# Patient Record
Sex: Female | Born: 1985 | Race: Black or African American | Hispanic: No | Marital: Single | Smoking: Never smoker
Health system: Southern US, Community
[De-identification: ages and names within clinical notes are randomized; demographics above are authoritative.]

## PROBLEM LIST (undated history)

## (undated) DIAGNOSIS — I10 Essential (primary) hypertension: Secondary | ICD-10-CM

---

## 2013-09-04 ENCOUNTER — Emergency Department: Payer: Self-pay | Admitting: Emergency Medicine

## 2013-11-15 ENCOUNTER — Emergency Department (HOSPITAL_COMMUNITY): Payer: Medicaid Other

## 2013-11-15 ENCOUNTER — Encounter (HOSPITAL_COMMUNITY): Payer: Self-pay | Admitting: Emergency Medicine

## 2013-11-15 ENCOUNTER — Emergency Department (HOSPITAL_COMMUNITY)
Admission: EM | Admit: 2013-11-15 | Discharge: 2013-11-15 | Disposition: A | Discharge status: Home / Self Care | Payer: Medicaid Other | Attending: Emergency Medicine | Admitting: Emergency Medicine

## 2013-11-15 DIAGNOSIS — Y9359 Activity, other involving other sports and athletics played individually: Secondary | ICD-10-CM | POA: Diagnosis not present

## 2013-11-15 DIAGNOSIS — S46909A Unspecified injury of unspecified muscle, fascia and tendon at shoulder and upper arm level, unspecified arm, initial encounter: Secondary | ICD-10-CM | POA: Diagnosis present

## 2013-11-15 DIAGNOSIS — Y9239 Other specified sports and athletic area as the place of occurrence of the external cause: Secondary | ICD-10-CM | POA: Insufficient documentation

## 2013-11-15 DIAGNOSIS — S40012A Contusion of left shoulder, initial encounter: Secondary | ICD-10-CM

## 2013-11-15 DIAGNOSIS — S4980XA Other specified injuries of shoulder and upper arm, unspecified arm, initial encounter: Secondary | ICD-10-CM | POA: Insufficient documentation

## 2013-11-15 DIAGNOSIS — S40019A Contusion of unspecified shoulder, initial encounter: Secondary | ICD-10-CM | POA: Insufficient documentation

## 2013-11-15 DIAGNOSIS — Y92838 Other recreation area as the place of occurrence of the external cause: Secondary | ICD-10-CM

## 2013-11-15 DIAGNOSIS — W1789XA Other fall from one level to another, initial encounter: Secondary | ICD-10-CM | POA: Diagnosis not present

## 2013-11-15 MED ORDER — HYDROCODONE-ACETAMINOPHEN 5-325 MG PO TABS
2.0000 | ORAL_TABLET | Freq: Once | ORAL | Status: AC
  Administered 2013-11-15: 2 via ORAL
  Filled 2013-11-15: qty 2

## 2013-11-15 MED ORDER — HYDROCODONE-ACETAMINOPHEN 5-325 MG PO TABS: 1.0000 | ORAL_TABLET | Freq: Four times a day (QID) | ORAL | Status: DC | PRN

## 2013-11-15 NOTE — ED Notes (Signed)
Pt presents with c/o left shoulder pain. Pt says that [place] and [place] of the ride landing on that shoulder. Pt unsure of the height that she fell from, pt says that she landed on the slide when she fell.

## 2013-11-15 NOTE — ED Provider Notes (Addendum)
CSN: [id number]     Arrival date & time 11/15/13  1502 History   First MD Initiated Contact with Patient 11/15/13 1529     Chief Complaint  Patient presents with  . Shoulder Pain     (Consider location/radiation/quality/duration/timing/severity/associated sxs/prior Treatment) HPI Complains of left anterior shoulder pain, nonradiating onset p.m. today after she [place]. She reports that her left shoulder struck a slide . Pain is worse when she moves her shoulder. Pain is constant anterior shoulder nonradiating. No other associated symptoms. No treatment prior to coming History reviewed. No pertinent past medical history. History reviewed. No pertinent past surgical history. No family history on file. History  Substance Use Topics  . Smoking status: Never Smoker   . Smokeless tobacco: Not on file  . Alcohol Use: No   OB History   Grav Para Term Preterm Abortions TAB SAB Ect Mult Living                 Review of Systems  Constitutional: Negative.   Respiratory: Negative.   Musculoskeletal: Positive for arthralgias.       Left shoulder pain      Allergies  Review of patient's allergies indicates no known allergies.  Home Medications   Prior to Admission medications   Not on File   BP 142/90  Pulse 110  Temp(Src) 99.1 F (37.3 C) (Oral)  Resp 16  SpO2 99%  LMP 11/01/2013 Physical Exam  Nursing note and vitals reviewed. Constitutional: She is oriented to person, place, and time. She appears well-developed and well-nourished. No distress.  HENT:  Head: Normocephalic and atraumatic.  Eyes: Conjunctivae are normal. Pupils are equal, round, and reactive to light.  Neck: Neck supple. No tracheal deviation present.  Cardiovascular: Regular rhythm.   Pulmonary/Chest: Effort normal and breath sounds normal.  Abdominal:  obese  Musculoskeletal: Normal range of motion. She exhibits no edema and no tenderness.  Left upper extremity skin  intact. No deformity no swelling tender at anterior shoulder. No a.c. joint tenderness. No clavicle tenderness. Radial pulse 2+. Full range of motion of elbow wrist and hand. She will not move shoulder in any direction. All other extremities a contusion abrasion or tenderness neurovascular intact  Neurological: She is alert and oriented to person, place, and time. Coordination normal.  Skin: Skin is warm and dry. No rash noted.  Psychiatric: She has a normal mood and affect.    ED Course  Procedures (including critical care time) Labs Review Labs Reviewed - No data to display  Imaging Review No results found.   EKG Interpretation None     430 p.m. she reports pain not improved after treatment with Norco however she does feel ready go home. She appears in no distress. X-rays viewed by me No results found for this or any previous visit. Dg Shoulder Left  11/15/2013   CLINICAL DATA:  Larey Seat onto LEFT shoulder, pain anteriorly  EXAM: LEFT SHOULDER - 2+ VIEW  COMPARISON:  None  FINDINGS: Unable to obtain axillary view due to pain.  Osseous mineralization normal.  AC joint alignment grossly normal.  No acute fracture, dislocation or bone destruction.  Visualized LEFT ribs intact.  IMPRESSION: No acute osseous abnormalities.   Electronically Signed   By: Ulyses Southward M.D.   On: 11/15/2013 16:16    MDM  Plan prescription Norco. Orthopedic referral. I did not recommend shoulder mobilization as concerned for adhesive capsulitis Dx contusion of  Left shoulder  Final diagnoses:  None        Doug SouSam Perez Dirico, MD 11/15/13 1639  Doug SouSam Taima Rada, MD 11/15/13 [phone]

## 2013-11-15 NOTE — Discharge Instructions (Signed)
Contusion Take Advil for mild pain or the pain medicine prescribed for bad pain. Try to use your shoulder as much as possible. Call Dr.Duda to schedule an office appointment if having significant pain or difficulty using your shoulder in 3 or 4 days Shoulder Exercises EXERCISES  RANGE OF MOTION (ROM) AND STRETCHING EXERCISES These exercises may help you when beginning to rehabilitate your injury. Your symptoms may resolve with or without further involvement from your physician, physical therapist or athletic trainer. While completing these exercises, remember:   Restoring tissue flexibility helps normal motion to return to the joints. This allows healthier, less painful movement and activity.  An effective stretch should be held for at least 30 seconds.  A stretch should never be painful. You should only feel a gentle lengthening or release in the stretched tissue. ROM - Pendulum  Bend at the waist so that your right / left arm falls away from your body. Support yourself with your opposite hand on a solid surface, such as a table or a countertop.  Your right / left arm should be perpendicular to the ground. If it is not perpendicular, you need to lean over farther. Relax the muscles in your right / left arm and shoulder as much as possible.  Gently sway your hips and trunk so they move your right / left arm without any use of your right / left shoulder muscles.  Progress your movements so that your right / left arm moves side to side, then forward and backward, and finally, both clockwise and counterclockwise.  Complete __________ repetitions in each direction. Many people use this exercise to relieve discomfort in their shoulder as well as to gain range of motion. Repeat __________ times. Complete this exercise __________ times per day. STRETCH - Flexion, Standing  Stand with good posture. With an underhand grip on your right / left hand and an overhand grip on the opposite hand, grasp a  broomstick or cane so that your hands are a little more than shoulder-width apart.  Keeping your right / left elbow straight and shoulder muscles relaxed, push the stick with your opposite hand to raise your right / left arm in front of your body and then overhead. Raise your arm until you feel a stretch in your right / left shoulder, but before you have increased shoulder pain.  Try to avoid shrugging your right / left shoulder as your arm rises by keeping your shoulder blade tucked down and toward your mid-back spine. Hold __________ seconds.  Slowly return to the starting position. Repeat __________ times. Complete this exercise __________ times per day. STRETCH - Internal Rotation  Place your right / left hand behind your back, palm-up.  Throw a towel or belt over your opposite shoulder. Grasp the towel/belt with your right / left hand.  While keeping an upright posture, gently pull up on the towel/belt until you feel a stretch in the front of your right / left shoulder.  Avoid shrugging your right / left shoulder as your arm rises by keeping your shoulder blade tucked down and toward your mid-back spine.  Hold __________. Release the stretch by lowering your opposite hand. Repeat __________ times. Complete this exercise __________ times per day. STRETCH - External Rotation and Abduction  Stagger your stance through a doorframe. It does not matter which foot is forward.  As instructed by your physician, physical therapist or athletic trainer, place your hands:  And forearms above your head and on the door frame.  And  forearms at head-height and on the door frame.  At elbow-height and on the door frame.  Keeping your head and chest upright and your stomach muscles tight to prevent over-extending your low-back, slowly shift your weight onto your front foot until you feel a stretch across your chest and/or in the front of your shoulders.  Hold __________ seconds. Shift your weight  to your back foot to release the stretch. Repeat __________ times. Complete this stretch __________ times per day.  STRENGTHENING EXERCISES  These exercises may help you when beginning to rehabilitate your injury. They may resolve your symptoms with or without further involvement from your physician, physical therapist or athletic trainer. While completing these exercises, remember:   Muscles can gain both the endurance and the strength needed for everyday activities through controlled exercises.  Complete these exercises as instructed by your physician, physical therapist or athletic trainer. Progress the resistance and repetitions only as guided.  You may experience muscle soreness or fatigue, but the pain or discomfort you are trying to eliminate should never worsen during these exercises. If this pain does worsen, stop and make certain you are following the directions exactly. If the pain is still present after adjustments, discontinue the exercise until you can discuss the trouble with your clinician.  If advised by your physician, during your recovery, avoid activity or exercises which involve actions that place your right / left hand or elbow above your head or behind your back or head. These positions stress the tissues which are trying to heal. STRENGTH - Scapular Depression and Adduction  With good posture, sit on a firm chair. Supported your arms in front of you with pillows, arm rests or a table top. Have your elbows in line with the sides of your body.  Gently draw your shoulder blades down and toward your mid-back spine. Gradually increase the tension without tensing the muscles along the top of your shoulders and the back of your neck.  Hold for __________ seconds. Slowly release the tension and relax your muscles completely before completing the next repetition.  After you have practiced this exercise, remove the arm support and complete it in standing as well as sitting. Repeat  __________ times. Complete this exercise __________ times per day.  STRENGTH - External Rotators  Secure a rubber exercise band/tubing to a fixed object so that it is at the same height as your right / left elbow when you are standing or sitting on a firm surface.  Stand or sit so that the secured exercise band/tubing is at your side that is not injured.  Bend your elbow 90 degrees. Place a folded towel or small pillow under your right / left arm so that your elbow is a few inches away from your side.  Keeping the tension on the exercise band/tubing, pull it away from your body, as if pivoting on your elbow. Be sure to keep your body steady so that the movement is only coming from your shoulder rotating.  Hold __________ seconds. Release the tension in a controlled manner as you return to the starting position. Repeat __________ times. Complete this exercise __________ times per day.  STRENGTH - Supraspinatus  Stand or sit with good posture. Grasp a __________ weight or an exercise band/tubing so that your hand is "thumbs-up," like when you shake hands.  Slowly lift your right / left hand from your thigh into the air, traveling about 30 degrees from straight out at your side. Lift your hand to shoulder height  or as far as you can without increasing any shoulder pain. Initially, many people do not lift their hands above shoulder height.  Avoid shrugging your right / left shoulder as your arm rises by keeping your shoulder blade tucked down and toward your mid-back spine.  Hold for __________ seconds. Control the descent of your hand as you slowly return to your starting position. Repeat __________ times. Complete this exercise __________ times per day.  STRENGTH - Shoulder Extensors  Secure a rubber exercise band/tubing so that it is at the height of your shoulders when you are either standing or sitting on a firm arm-less chair.  With a thumbs-up grip, grasp an end of the band/tubing in  each hand. Straighten your elbows and lift your hands straight in front of you at shoulder height. Step back away from the secured end of band/tubing until it becomes tense.  Squeezing your shoulder blades together, pull your hands down to the sides of your thighs. Do not allow your hands to go behind you.  Hold for __________ seconds. Slowly ease the tension on the band/tubing as you reverse the directions and return to the starting position. Repeat __________ times. Complete this exercise __________ times per day.  STRENGTH - Scapular Retractors  Secure a rubber exercise band/tubing so that it is at the height of your shoulders when you are either standing or sitting on a firm arm-less chair.  With a palm-down grip, grasp an end of the band/tubing in each hand. Straighten your elbows and lift your hands straight in front of you at shoulder height. Step back away from the secured end of band/tubing until it becomes tense.  Squeezing your shoulder blades together, draw your elbows back as you bend them. Keep your upper arm lifted away from your body throughout the exercise.  Hold __________ seconds. Slowly ease the tension on the band/tubing as you reverse the directions and return to the starting position. Repeat __________ times. Complete this exercise __________ times per day. STRENGTH - Scapular Depressors  Find a sturdy chair without wheels, such as a from a dining room table.  Keeping your feet on the floor, lift your bottom from the seat and lock your elbows.  Keeping your elbows straight, allow gravity to pull your body weight down. Your shoulders will rise toward your ears.  Raise your body against gravity by drawing your shoulder blades down your back, shortening the distance between your shoulders and ears. Although your feet should always maintain contact with the floor, your feet should progressively support less body weight as you get stronger.  Hold __________ seconds. In a  controlled and slow manner, lower your body weight to begin the next repetition. Repeat __________ times. Complete this exercise __________ times per day.  Document Released: 02/01/2005 Document Revised: 06/12/2011 Document Reviewed: 07/02/2008 [facility] Patient Information 2015 [place], [place]. This information is not intended to replace advice given to you by your health care provider. Make sure you discuss any questions you have with your health care provider.  A contusion is a deep bruise. Contusions happen when an injury causes bleeding under the skin. Signs of bruising include pain, puffiness (swelling), and discolored skin. The contusion may turn blue, purple, or yellow. HOME CARE   Put ice on the injured area.  Put ice in a plastic bag.  Place a towel between your skin and the bag.  Leave the ice on for 15-20 minutes, 03-04 times a day.  Only take medicine as told by your doctor.  Rest the injured area.  If possible, raise (elevate) the injured area to lessen puffiness. GET HELP RIGHT AWAY IF:   You have more bruising or puffiness.  You have pain that is getting worse.  Your puffiness or pain is not helped by medicine. MAKE SURE YOU:   Understand these instructions.  Will watch your condition.  Will get help right away if you are not doing well or get worse. Document Released: 09/06/2007 Document Revised: 06/12/2011 Document Reviewed: 01/23/2011 [facility] Patient Information 2015 [place], [place]. This information is not intended to replace advice given to you by your health care provider. Make sure you discuss any questions you have with your health care provider.

## 2013-11-16 ENCOUNTER — Emergency Department: Payer: Self-pay | Admitting: Student

## 2014-02-01 ENCOUNTER — Emergency Department: Payer: Self-pay | Admitting: Emergency Medicine

## 2014-02-26 ENCOUNTER — Emergency Department: Payer: Self-pay | Admitting: Emergency Medicine

## 2014-05-10 ENCOUNTER — Emergency Department: Payer: Self-pay | Admitting: Emergency Medicine

## 2014-12-03 ENCOUNTER — Other Ambulatory Visit: Payer: Self-pay

## 2014-12-03 ENCOUNTER — Emergency Department
Admission: EM | Admit: 2014-12-03 | Discharge: 2014-12-03 | Disposition: A | Discharge status: Home / Self Care | Payer: Medicaid Other | Attending: Emergency Medicine | Admitting: Emergency Medicine

## 2014-12-03 ENCOUNTER — Emergency Department: Payer: Medicaid Other

## 2014-12-03 DIAGNOSIS — R079 Chest pain, unspecified: Secondary | ICD-10-CM | POA: Diagnosis not present

## 2014-12-03 LAB — BASIC METABOLIC PANEL
ANION GAP: 7 (ref 5–15)
CALCIUM: 9.3 mg/dL (ref 8.9–10.3)
CO2: 29 mmol/L (ref 22–32)
Chloride: 104 mmol/L (ref 101–111)
Creatinine, Ser: 0.76 mg/dL (ref 0.44–1.00)
GFR calc Af Amer: >60 mL/min (ref >60)
GFR calc non Af Amer: >60 mL/min (ref >60)
Glucose, Bld: 107 mg/dL — ABNORMAL HIGH (ref 65–99)
Potassium: 3.7 mmol/L (ref 3.5–5.1)
Sodium: 140 mmol/L (ref 135–145)

## 2014-12-03 LAB — CBC
HCT: 37.8 % (ref 35.0–47.0)
HEMOGLOBIN: 12 g/dL (ref 12.0–16.0)
MCH: 22.2 pg — AB (ref 26.0–34.0)
MCHC: 31.7 g/dL — ABNORMAL LOW (ref 32.0–36.0)
MCV: 70.2 fL — ABNORMAL LOW (ref 80.0–100.0)
Platelets: 365 10*3/uL (ref 150–440)
RBC: 5.39 MIL/uL — ABNORMAL HIGH (ref 3.80–5.20)
RDW: 17.7 % — AB (ref 11.5–14.5)
WBC: 9.4 10*3/uL (ref 3.6–11.0)

## 2014-12-03 LAB — TROPONIN I: Troponin I: <0.03 ng/mL (ref <0.031)

## 2014-12-03 LAB — FIBRIN DERIVATIVES D-DIMER (ARMC ONLY): Fibrin derivatives D-dimer (ARMC): 1024 — ABNORMAL HIGH (ref 0–499)

## 2014-12-03 MED ORDER — IOHEXOL 350 MG/ML SOLN
100.0000 mL | Freq: Once | INTRAVENOUS | Status: AC | PRN
  Administered 2014-12-03: 100 mL via INTRAVENOUS

## 2014-12-03 NOTE — ED Notes (Signed)
Pt ambulatory to triage with reports of upper chest pain that began last night. Pt denies sob/cough.

## 2014-12-03 NOTE — ED Notes (Signed)
Pt reports calling EMS last night after chest pain began and EMS reported BP was elevated. Pt did not come to ED. Pt reports pain has continued into today.

## 2014-12-03 NOTE — ED Notes (Signed)
Teaching provided by RN about scan and results. Follow up encouraged and questions answered. Pt verbalized understanding and denies having further questions.

## 2014-12-03 NOTE — Discharge Instructions (Signed)
No certain cause was found for your chest discomfort, however your exam and evaluation are reassuring today. I suspect that with the burning there may be an element of acid reflux/indigestion. Avoid fatty, spicy, citrus/acidic foods to help with symptoms. You might try over-the-counter Tums or Maalox for any symptoms of burning or abdominal discomfort.  Giving your family history, and the atypical symptoms, I am recommending you follow-up with a cardiologist, call the office for an appointment 1-2 days.  Return to emergency department for any worsening condition including chest pain, trouble breathing, palpations, nausea, sweating, weakness, numbness, or any altered mental status or other symptoms concerning to you.   Chest Pain (Nonspecific) It is often hard to give a specific diagnosis for the cause of chest pain. There is always a chance that your pain could be related to something serious, such as a heart attack or a blood clot in the lungs. You need to follow up with your health care provider for further evaluation. CAUSES   Heartburn.  Pneumonia or bronchitis.  Anxiety or stress.  Inflammation around your heart (pericarditis) or lung (pleuritis or pleurisy).  A blood clot in the lung.  A collapsed lung (pneumothorax). It can develop suddenly on its own (spontaneous pneumothorax) or from trauma to the chest.  Shingles infection (herpes zoster virus). The chest wall is composed of bones, muscles, and cartilage. Any of these can be the source of the pain.  The bones can be bruised by injury.  The muscles or cartilage can be strained by coughing or overwork.  The cartilage can be affected by inflammation and become sore (costochondritis). DIAGNOSIS  Lab tests or other studies may be needed to find the cause of your pain. Your health care provider may have you take a test called an ambulatory electrocardiogram (ECG). An ECG records your heartbeat patterns over a 24-hour period. You  may also have other tests, such as:  Transthoracic echocardiogram (TTE). During echocardiography, sound waves are used to evaluate how blood flows through your heart.  Transesophageal echocardiogram (TEE).  Cardiac monitoring. This allows your health care provider to monitor your heart rate and rhythm in real time.  Holter monitor. This is a portable device that records your heartbeat and can help diagnose heart arrhythmias. It allows your health care provider to track your heart activity for several days, if needed.  Stress tests by exercise or by giving medicine that makes the heart beat faster. TREATMENT   Treatment depends on what may be causing your chest pain. Treatment may include:  Acid blockers for heartburn.  Anti-inflammatory medicine.  Pain medicine for inflammatory conditions.  Antibiotics if an infection is present.  You may be advised to change lifestyle habits. This includes stopping smoking and avoiding alcohol, caffeine, and chocolate.  You may be advised to keep your head raised (elevated) when sleeping. This reduces the chance of acid going backward from your stomach into your esophagus. Most of the time, nonspecific chest pain will improve within 2-3 days with rest and mild pain medicine.  HOME CARE INSTRUCTIONS   If antibiotics were prescribed, take them as directed. Finish them even if you start to feel better.  For the next few days, avoid physical activities that bring on chest pain. Continue physical activities as directed.  Do not use any tobacco products, including cigarettes, chewing tobacco, or electronic cigarettes.  Avoid drinking alcohol.  Only take medicine as directed by your health care provider.  Follow your health care provider's suggestions for further testing  if your chest pain does not go away.  Keep any follow-up appointments you made. If you do not go to an appointment, you could develop lasting (chronic) problems with pain. If there  is any problem keeping an appointment, call to reschedule. SEEK MEDICAL CARE IF:   Your chest pain does not go away, even after treatment.  You have a rash with blisters on your chest.  You have a fever. SEEK IMMEDIATE MEDICAL CARE IF:   You have increased chest pain or pain that spreads to your arm, neck, jaw, back, or abdomen.  You have shortness of breath.  You have an increasing cough, or you cough up blood.  You have severe back or abdominal pain.  You feel nauseous or vomit.  You have severe weakness.  You faint.  You have chills. This is an emergency. Do not wait to see if the pain will go away. Get medical help at once. Call your local emergency services (911 in [place] Do not drive yourself to the hospital. MAKE SURE YOU:   Understand these instructions.  Will watch your condition.  Will get help right away if you are not doing well or get worse. Document Released: 12/28/2004 Document Revised: 03/25/2013 Document Reviewed: 10/24/2007 [facility] Patient Information 2015 [place], [place]. This information is not intended to replace advice given to you by your health care provider. Make sure you discuss any questions you have with your health care provider.

## 2014-12-03 NOTE — ED Provider Notes (Signed)
[facility] Emergency Department Provider Note   ____________________________________________  Time seen: 12 PM I have reviewed the triage vital signs and the triage nursing note.  HISTORY  Chief Complaint Chest Pain   Historian Patient  HPI Amanda Hunter is a 29 y.o. female who reports she had some upper chest burning that started yesterday evening.  She did go to [facility] and check her blood pressure and reports it was elevated. She does not take blood pressure medication. She did not have any bad food or known indigestion history per the patient. Overnight she started having some pleuritic chest pain and mild shortness of breath. She does not report significant stressors. No coughing or fevers. No weakness or numbness. Father who had a MI when he was young, and she reports that her sister has had a blood clot in her lung. She is not on any hormonal birth control or other hormone pills.    No past medical history on file. obesity  There are no active problems to display for this patient.   No past surgical history on file.  Current Outpatient Rx  Name  Route  Sig  Dispense  Refill  . HYDROcodone-acetaminophen (NORCO) 5-325 MG per tablet   Oral   Take 1-2 tablets by mouth every 6 (six) hours as needed for severe pain.   10 tablet   0     Allergies Review of patient's allergies indicates no known allergies.  No family history on file.  Social History Social History  Substance Use Topics  . Smoking status: Never Smoker   . Smokeless tobacco: Not on file  . Alcohol Use: No   lives at home with her daughter.  Review of Systems  Constitutional: Negative for fever. Eyes: Negative for visual changes. ENT: Negative for sore throat. Cardiovascular: Negative for palpitations Respiratory: Negative for cough Gastrointestinal: Negative for abdominal pain, vomiting and diarrhea. Genitourinary: Negative for dysuria. Musculoskeletal: Negative for back  pain. Skin: Negative for rash. Neurological: Negative for headache. 10 point Review of Systems otherwise negative ____________________________________________   PHYSICAL EXAM:  VITAL SIGNS: ED Triage Vitals  Enc Vitals Group     BP 12/03/14 1009 131/80 mmHg     Pulse Rate 12/03/14 1009 88     Resp 12/03/14 1009 16     Temp 12/03/14 1009 98.2 F (36.8 C)     Temp Source 12/03/14 1009 Oral     SpO2 12/03/14 1009 100 %     Weight 12/03/14 1009 234 lb (106.142 kg)     Height 12/03/14 1009 5' (1.524 m)     Head Cir --      Peak Flow --      Pain Score 12/03/14 1010 8     Pain Loc --      Pain Edu? --      Excl. in GC? --      Constitutional: Alert and oriented. Well appearing and in no distress. Eyes: Conjunctivae are normal. PERRL. Normal extraocular movements. ENT   Head: Normocephalic and atraumatic.   Nose: No congestion/rhinnorhea.   Mouth/Throat: Mucous membranes are moist.   Neck: No stridor. Cardiovascular/Chest: Normal rate, regular rhythm.  No murmurs, rubs, or gallops. Respiratory: Normal respiratory effort without tachypnea nor retractions. Breath sounds are clear and equal bilaterally. No wheezes/rales/rhonchi. Gastrointestinal: Soft. No distention, no guarding, no rebound. Nontender. Obese.  Genitourinary/rectal:Deferred Musculoskeletal: Nontender with normal range of motion in all extremities. No joint effusions.  No lower extremity tenderness.  No edema. Neurologic:  Normal speech and language. No gross or focal neurologic deficits are appreciated. Skin:  Skin is warm, dry and intact. No rash noted. Psychiatric: Mood and affect are normal. Speech and behavior are normal. Patient exhibits appropriate insight and judgment.  ____________________________________________   EKG I, Governor Rooks, MD, the attending physician have personally viewed and interpreted all ECGs.  87 bpm. Normal sinus rhythm with sinus arrhythmia. Narrow QRS. Normal axis.  Nonspecific T wave. ____________________________________________  LABS (pertinent positives/negatives)  Basic metabolic panel without significant abnormality CBC without significant abnormality Troponin less than 0.03 D-dimer greater than 1000  ____________________________________________  RADIOLOGY All Xrays were viewed by me. Imaging interpreted by Radiologist.  Chest x-ray two-view: Negative __________________________________________  PROCEDURES  Procedure(s) performed: None  Critical Care performed: None  ____________________________________________   ED COURSE / ASSESSMENT AND PLAN  CONSULTATIONS: None  Pertinent labs & imaging results that were available during my care of the patient were reviewed by me and considered in my medical decision making (see chart for details).    Patient has atypical chest discomfort which was both described as burning centrally, as well as pleuritic. Feel ACS is unlikely, and she has reassuring exam, evaluation, and laboratory findings. Chest discomfort started over 4 hours ago, did not feel repeat troponin is necessary. I am going to refer her to cardiology for follow-up given her history of obesity and family history of MI at a young age.  Patient care transferred to Dr. Darnelle Catalan at shift change, 3 PM. CT chest is pending.  Patient / Family / Caregiver informed of clinical course, medical decision-making process, and agree with plan.   I discussed return precautions, follow-up instructions, and discharged instructions with patient and/or family.  ___________________________________________   FINAL CLINICAL IMPRESSION(S) / ED DIAGNOSES   Final diagnoses:  None       Governor Rooks, MD 12/04/14 1520

## 2014-12-29 ENCOUNTER — Encounter: Payer: Self-pay | Admitting: Emergency Medicine

## 2014-12-29 ENCOUNTER — Emergency Department
Admission: EM | Admit: 2014-12-29 | Discharge: 2014-12-29 | Disposition: A | Discharge status: Home / Self Care | Payer: Medicaid Other | Attending: Emergency Medicine | Admitting: Emergency Medicine

## 2014-12-29 DIAGNOSIS — H109 Unspecified conjunctivitis: Secondary | ICD-10-CM | POA: Insufficient documentation

## 2014-12-29 DIAGNOSIS — H5711 Ocular pain, right eye: Secondary | ICD-10-CM | POA: Diagnosis present

## 2014-12-29 MED ORDER — OLOPATADINE HCL 0.1 % OP SOLN: 1.0000 [drp] | Freq: Two times a day (BID) | OPHTHALMIC | Status: DC

## 2014-12-29 MED ORDER — GENTAMICIN SULFATE 0.3 % OP SOLN: 1.0000 [drp] | Freq: Three times a day (TID) | OPHTHALMIC | Status: AC

## 2014-12-29 MED ORDER — TETRACAINE HCL 0.5 % OP SOLN
OPHTHALMIC | Status: AC
  Filled 2014-12-29: qty 2

## 2014-12-29 NOTE — Discharge Instructions (Signed)
Bacterial Conjunctivitis °Bacterial conjunctivitis (commonly called pink eye) is redness, soreness, or puffiness (inflammation) of the white part of your eye. It is caused by a germ called bacteria. These germs can easily spread from person to person (contagious). Your eye often will become red or pink. Your eye may also become irritated, watery, or have a thick discharge.  °HOME CARE  °· Apply a cool, clean washcloth over closed eyelids. Do this for 10-20 minutes, 3-4 times a day while you have pain. °· Gently wipe away any fluid coming from the eye with a warm, wet washcloth or cotton ball. °· Wash your hands often with soap and water. Use paper towels to dry your hands. °· Do not share towels or washcloths. °· Change or wash your pillowcase every day. °· Do not use eye makeup until the infection is gone. °· Do not use machines or drive if your vision is blurry. °· Stop using contact lenses. Do not use them again until your doctor says it is okay. °· Do not touch the tip of the eye drop bottle or medicine tube with your fingers when you put medicine on the eye. °GET HELP RIGHT AWAY IF:  °· Your eye is not better after 3 days of starting your medicine. °· You have a yellowish fluid coming out of the eye. °· You have more pain in the eye. °· Your eye redness is spreading. °· Your vision becomes blurry. °· You have a fever or lasting symptoms for more than 2-3 days. °· You have a fever and your symptoms suddenly get worse. °· You have pain in the face. °· Your face gets red or puffy (swollen). °MAKE SURE YOU:  °· Understand these instructions. °· Will watch this condition. °· Will get help right away if you are not doing well or get worse. °Document Released: 12/28/2007 Document Revised: 03/06/2012 Document Reviewed: 11/24/2011 °ExitCare® Patient Information ©2015 [employer]. This information is not intended to replace advice given to you by your health care provider. Make sure you discuss any questions you have  with your health care provider. ° °

## 2014-12-29 NOTE — ED Provider Notes (Signed)
[facility] Emergency Department [place] Note  ____________________________________________  Time seen: Approximately 5:53 PM  I have reviewed the triage vital signs and the nursing notes.   HISTORY  Chief Complaint Eye Drainage    HPI Amanda Hunter is a 29 y.o. female patient complaining of right eye redness,drainage and painonset this morning. Patient denies any vision loss. Patient denies any use of contact lenses. Patient is rating her pain discomfort as a 10 over 10. No palliative measures taken for this complaint. Patient denies any URI signs symptoms.   History reviewed. No pertinent past medical history.  There are no active problems to display for this patient.   History reviewed. No pertinent past surgical history.  Current Outpatient Rx  Name  Route  Sig  Dispense  Refill  . gentamicin (GARAMYCIN) 0.3 % ophthalmic solution   Both Eyes   Place 1 drop into both eyes 3 (three) times daily.   5 mL   0   . olopatadine (PATANOL) 0.1 % ophthalmic solution   Right Eye   Place 1 drop into the right eye 2 (two) times daily.   5 mL   12     Allergies Review of patient's allergies indicates no known allergies.  History reviewed. No pertinent family history.  Social History Social History  Substance Use Topics  . Smoking status: Never Smoker   . Smokeless tobacco: None  . Alcohol Use: No    Review of Systems Constitutional: No fever/chills Eyes: No visual changes. Redness and discharge from right eye. ENT: No sore throat. Cardiovascular: Denies chest pain. Respiratory: Denies shortness of breath. Gastrointestinal: No abdominal pain.  No nausea, no vomiting.  No diarrhea.  No constipation. Genitourinary: Negative for dysuria. Musculoskeletal: Negative for back pain. Skin: Negative for rash. Neurological: Negative for headaches, focal weakness or numbness. 10-point ROS otherwise  negative.  ____________________________________________   PHYSICAL EXAM:  VITAL SIGNS: ED Triage Vitals  Enc Vitals Group     BP 12/29/14 1655 134/85 mmHg     Pulse Rate 12/29/14 1655 99     Resp 12/29/14 1655 20     Temp 12/29/14 1655 98.2 F (36.8 C)     Temp Source 12/29/14 1655 Oral     SpO2 12/29/14 1655 99 %     Weight 12/29/14 1655 230 lb (104.327 kg)     Height 12/29/14 1655  (1.626 m)     Head Cir --      Peak Flow --      Pain Score 12/29/14 1651 10     Pain Loc --      Pain Edu? --      Excl. in GC? --     Constitutional: Alert and oriented. Well appearing and in no acute distress. Eyes: Conjunctivae are erythematous. PERRL. EOMI. greenish discharge. Head: Atraumatic. Nose: No congestion/rhinnorhea. Mouth/Throat: Mucous membranes are moist.  Oropharynx non-erythematous. Neck: No stridor.   Hematological/Lymphatic/Immunilogical: No cervical lymphadenopathy. Cardiovascular: Normal rate, regular rhythm. Grossly normal heart sounds.  Good peripheral circulation. Respiratory: Normal respiratory effort.  No retractions. Lungs CTAB. Gastrointestinal: Soft and nontender. No distention. No abdominal bruits. No CVA tenderness. Musculoskeletal: No lower extremity tenderness nor edema.  No joint effusions. Neurologic:  Normal speech and language. No gross focal neurologic deficits are appreciated. No gait instability. Skin:  Skin is warm, dry and intact. No rash noted. Psychiatric: Mood and affect are normal. Speech and behavior are normal.  ____________________________________________   LABS (all labs ordered are listed, but  only abnormal results are displayed)  Labs Reviewed - No data to display ____________________________________________  EKG   ____________________________________________  RADIOLOGY   ____________________________________________   PROCEDURES  Procedure(s) performed: None  Critical Care performed:  No  ____________________________________________   INITIAL IMPRESSION / ASSESSMENT AND PLAN / ED COURSE  Pertinent labs & imaging results that were available during my care of the patient were reviewed by me and considered in my medical decision making (see chart for details).  Conjunctivitis right eye. Patient given home discharge instructions. Patient advised to treat both eyes. Advised follow-up with dental clinic. ____________________________________________   FINAL CLINICAL IMPRESSION(S) / ED DIAGNOSES  Final diagnoses:  Conjunctivitis of right eye      Joni Reining, PA-C 12/29/14 1804  Emily Filbert, MD 12/30/14 [id number]

## 2014-12-29 NOTE — ED Notes (Signed)
Pt to ed with c/o right eye pain and redness and irritation and drainage today.

## 2015-01-20 ENCOUNTER — Emergency Department
Admission: EM | Admit: 2015-01-20 | Discharge: 2015-01-20 | Discharge status: Against Medical Advice | Payer: Medicaid Other | Attending: Emergency Medicine | Admitting: Emergency Medicine

## 2015-01-20 ENCOUNTER — Encounter: Payer: Self-pay | Admitting: Emergency Medicine

## 2015-01-20 DIAGNOSIS — R51 Headache: Secondary | ICD-10-CM | POA: Diagnosis present

## 2015-01-20 DIAGNOSIS — I10 Essential (primary) hypertension: Secondary | ICD-10-CM | POA: Insufficient documentation

## 2015-01-20 HISTORY — DX: Essential (primary) hypertension: I10

## 2015-01-20 NOTE — ED Notes (Signed)
Called to take back to room multiple times.

## 2015-01-20 NOTE — ED Notes (Signed)
Pt states she has had a headache for the past few days, denies migraines, N/V. Pt appears in no distress.

## 2015-04-01 ENCOUNTER — Encounter: Payer: Self-pay | Admitting: Emergency Medicine

## 2015-04-01 ENCOUNTER — Emergency Department
Admission: EM | Admit: 2015-04-01 | Discharge: 2015-04-01 | Disposition: A | Discharge status: Home / Self Care | Payer: Medicaid Other | Attending: Emergency Medicine | Admitting: Emergency Medicine

## 2015-04-01 DIAGNOSIS — I10 Essential (primary) hypertension: Secondary | ICD-10-CM | POA: Diagnosis not present

## 2015-04-01 DIAGNOSIS — Z79899 Other long term (current) drug therapy: Secondary | ICD-10-CM | POA: Diagnosis not present

## 2015-04-01 DIAGNOSIS — R0981 Nasal congestion: Secondary | ICD-10-CM | POA: Diagnosis present

## 2015-04-01 DIAGNOSIS — J039 Acute tonsillitis, unspecified: Secondary | ICD-10-CM

## 2015-04-01 MED ORDER — AMOXICILLIN 500 MG PO TABS: 500.0000 mg | ORAL_TABLET | Freq: Three times a day (TID) | ORAL | Status: DC

## 2015-04-01 MED ORDER — PREDNISONE 10 MG (21) PO TBPK: ORAL_TABLET | ORAL | Status: DC

## 2015-04-01 NOTE — ED Notes (Signed)
States she is having chest and nasal congestion for about 1 week occasional non prod cough

## 2015-04-01 NOTE — Discharge Instructions (Signed)
Tonsillitis Tonsillitis is an infection of the throat. This infection causes the tonsils to become red, tender, and puffy (swollen). Tonsils are groups of tissue at the back of your throat. If bacteria caused your infection, antibiotic medicine will be given to you. Sometimes symptoms of tonsillitis can be relieved with the use of steroid medicine. If your tonsillitis is severe and happens often, you may need to get your tonsils removed (tonsillectomy). HOME CARE   Rest and sleep often.  Drink enough fluids to keep your pee (urine) clear or pale yellow.  While your throat is sore, eat soft or liquid foods like:  Soup.  Ice cream.  Instant breakfast drinks.  Eat frozen ice pops.  Gargle with a warm or cold liquid to help soothe the throat. Gargle with a water and salt mix. Mix 1/4 teaspoon of salt and 1/4 teaspoon of baking soda in 1 cup of water.  Only take medicines as told by your doctor.  If you are given medicines (antibiotics), take them as told. Finish them even if you start to feel better. GET HELP IF:  You have large, tender lumps in your neck.  You have a rash.  You cough up green, yellow-brown, or bloody fluid.  You cannot swallow liquids or food for 24 hours.  You notice that only one of your tonsils is swollen. GET HELP RIGHT AWAY IF:   You throw up (vomit).  You have a very bad headache.  You have a stiff neck.  You have chest pain.  You have trouble breathing or swallowing.  You have bad throat pain, drooling, or your voice changes.  You have bad pain not helped by medicine.  You cannot fully open your mouth.  You have redness, puffiness, or bad pain in the neck.  You have a fever. MAKE SURE YOU:   Understand these instructions.  Will watch your condition.  Will get help right away if you are not doing well or get worse.   This information is not intended to replace advice given to you by your health care provider. Make sure you discuss any  questions you have with your health care provider.   Document Released: 09/06/2007 Document Revised: 03/25/2013 Document Reviewed: 09/06/2012 Elsevier Interactive Patient Education 2016 Else[employer] Inc.  Upper Respiratory Infection, Adult Most upper respiratory infections (URIs) are a viral infection of the air passages leading to the lungs. A URI affects the nose, throat, and upper air passages. The most common type of URI is nasopharyngitis and is typically referred to as "the common cold." URIs run their course and usually go away on their own. Most of the time, a URI does not require medical attention, but sometimes a bacterial infection in the upper airways can follow a viral infection. This is called a secondary infection. Sinus and middle ear infections are common types of secondary upper respiratory infections. Bacterial pneumonia can also complicate a URI. A URI can worsen asthma and chronic obstructive pulmonary disease (COPD). Sometimes, these complications can require emergency medical care and may be life threatening.  CAUSES Almost all URIs are caused by viruses. A virus is a type of germ and can spread from one person to another.  RISKS FACTORS You may be at risk for a URI if:   You smoke.   You have chronic heart or lung disease.  You have a weakened defense (immune) system.   You are very young or very old.   You have nasal allergies or asthma.  You  work in crowded or poorly ventilated areas.  You work in health care facilities or schools. SIGNS AND SYMPTOMS  Symptoms typically develop 2-3 days after you come in contact with a cold virus. Most viral URIs last 7-10 days. However, viral URIs from the influenza virus (flu virus) can last 14-18 days and are typically more severe. Symptoms may include:   Runny or stuffy (congested) nose.   Sneezing.   Cough.   Sore throat.   Headache.   Fatigue.   Fever.   Loss of appetite.   Pain in your forehead,  behind your eyes, and over your cheekbones (sinus pain).  Muscle aches.  DIAGNOSIS  Your health care provider may diagnose a URI by:  Physical exam.  Tests to check that your symptoms are not due to another condition such as:  Strep throat.  Sinusitis.  Pneumonia.  Asthma. TREATMENT  A URI goes away on its own with time. It cannot be cured with medicines, but medicines may be prescribed or recommended to relieve symptoms. Medicines may help:  Reduce your fever.  Reduce your cough.  Relieve nasal congestion. HOME CARE INSTRUCTIONS   Take medicines only as directed by your health care provider.   Gargle warm saltwater or take cough drops to comfort your throat as directed by your health care provider.  Use a warm mist humidifier or inhale steam from a shower to increase air moisture. This may make it easier to breathe.  Drink enough fluid to keep your urine clear or pale yellow.   Eat soups and other clear broths and maintain good nutrition.   Rest as needed.   Return to work when your temperature has returned to normal or as your health care provider advises. You may need to stay home longer to avoid infecting others. You can also use a face mask and careful hand washing to prevent spread of the virus.  Increase the usage of your inhaler if you have asthma.   Do not use any tobacco products, including cigarettes, chewing tobacco, or electronic cigarettes. If you need help quitting, ask your health care provider. PREVENTION  The best way to protect yourself from getting a cold is to practice good hygiene.   Avoid oral or hand contact with people with cold symptoms.   Wash your hands often if contact occurs.  There is no clear evidence that vitamin C, vitamin E, echinacea, or exercise reduces the chance of developing a cold. However, it is always recommended to get plenty of rest, exercise, and practice good nutrition.  SEEK MEDICAL CARE IF:   You are getting  worse rather than better.   Your symptoms are not controlled by medicine.   You have chills.  You have worsening shortness of breath.  You have brown or red mucus.  You have yellow or brown nasal discharge.  You have pain in your face, especially when you bend forward.  You have a fever.  You have swollen neck glands.  You have pain while swallowing.  You have white areas in the back of your throat. SEEK IMMEDIATE MEDICAL CARE IF:   You have severe or persistent:  Headache.  Ear pain.  Sinus pain.  Chest pain.  You have chronic lung disease and any of the following:  Wheezing.  Prolonged cough.  Coughing up blood.  A change in your usual mucus.  You have a stiff neck.  You have changes in your:  Vision.  Hearing.  Thinking.  Mood. MAKE SURE YOU:  Understand these instructions. °· Will watch your condition. °· Will get help right away if you are not doing well or get worse. °  °This information is not intended to replace advice given to you by your health care provider. Make sure you discuss any questions you have with your health care provider. °  °Document Released: 09/13/2000 Document Revised: 08/04/2014 Document Reviewed: 06/25/2013 °Elsevier Interactive Patient Education ©2016 Elsevier Inc. ° °

## 2015-04-01 NOTE — ED Provider Notes (Signed)
[facility] Emergency Department Provider Note ____________________________________________  Time seen: Approximately 11:28 AM  I have reviewed the triage vital signs and the nursing notes.   HISTORY  Chief Complaint URI   HPI Amanda Hunter is a 29 y.o. female who presents to the emergency department for evaluation of chest, nasal congestion, and sore throat/throat swelling for the past week. No relief with OTC medications. No fever.   Past Medical History  Diagnosis Date  . Hypertension     There are no active problems to display for this patient.   History reviewed. No pertinent past surgical history.  Current Outpatient Rx  Name  Route  Sig  Dispense  Refill  . amoxicillin (AMOXIL) 500 MG tablet   Oral   Take 1 tablet (500 mg total) by mouth 3 (three) times daily.   30 tablet   0   . olopatadine (PATANOL) 0.1 % ophthalmic solution   Right Eye   Place 1 drop into the right eye 2 (two) times daily.   5 mL   12   . predniSONE (STERAPRED UNI-PAK 21 TAB) 10 MG (21) TBPK tablet      Take 6 tablets on day 1 Take 5 tablets on day 2 Take 4 tablets on day 3 Take 3 tablets on day 4 Take 2 tablets on day 5 Take 1 tablet on day 6   21 tablet   0     Allergies Review of patient's allergies indicates no known allergies.  No family history on file.  Social History Social History  Substance Use Topics  . Smoking status: Never Smoker   . Smokeless tobacco: None  . Alcohol Use: No    Review of Systems Constitutional: No fever/chills Eyes: No visual changes. ENT: Positive for sore throat. Cardiovascular: Denies chest pain. Respiratory: Negative for shortness of breath. Positive for cough. Gastrointestinal: No abdominal pain. No nausea,  No vomiting.  No diarrhea.  Genitourinary: Negative for dysuria. Musculoskeletal: Negative for body aches Skin: Negative for rash. Neurological: Negative for headaches, Negative for focal weakness or  numbness.  10-point ROS otherwise negative.  ____________________________________________   PHYSICAL EXAM:  VITAL SIGNS: ED Triage Vitals  Enc Vitals Group     BP 04/01/15 1038 131/79 mmHg     Pulse Rate 04/01/15 1038 107     Resp 04/01/15 1038 18     Temp 04/01/15 1038 99 F (37.2 C)     Temp Source 04/01/15 1038 Oral     SpO2 04/01/15 1038 98 %     Weight 04/01/15 1038 260 lb (117.935 kg)     Height 04/01/15 1038  (1.626 m)     Head Cir --      Peak Flow --      Pain Score --      Pain Loc --      Pain Edu? --      Excl. in GC? --    Constitutional: Alert and oriented. Well appearing and in no acute distress. Eyes: Conjunctivae are normal. PERRL. EOMI. Ears: Bilateral TM normal Head: Atraumatic. Nose: No congestion/rhinnorhea. Mouth/Throat: Mucous membranes are moist.  Oropharynx erythematous with tonsillar edema and exudate. Neck: No stridor.  Lymphatic: Bilateral anterior cervical lymphadenopathy. Cardiovascular: Normal rate, regular rhythm. Grossly normal heart sounds.  Good peripheral circulation. Respiratory: Normal respiratory effort.  No retractions. Lungs clear to auscultation. Gastrointestinal: Soft and nontender. No distention. No abdominal bruits. No CVA tenderness. Musculoskeletal: No joint pain reported. Neurologic:  Normal speech and  language. No gross focal neurologic deficits are appreciated. Speech is normal. No gait instability. Skin:  Skin is warm, dry and intact. No rash noted. Psychiatric: Mood and affect are normal. Speech and behavior are normal.  ____________________________________________   LABS (all labs ordered are listed, but only abnormal results are displayed)  Labs Reviewed - No data to display ____________________________________________  EKG   ____________________________________________  RADIOLOGY  Not indicated. ____________________________________________   PROCEDURES  Procedure(s) performed: None  Critical  Care performed: No  ____________________________________________   INITIAL IMPRESSION / ASSESSMENT AND PLAN / ED COURSE  Pertinent labs & imaging results that were available during my care of the patient were reviewed by me and considered in my medical decision making (see chart for details).  Patient was advised to follow up with the primary care provider of her choice for symptoms not relieved with amoxicillin and pred pack. She was advised to return to the ER for symptoms that change or worsen if unable to schedule an appointment. ____________________________________________   FINAL CLINICAL IMPRESSION(S) / ED DIAGNOSES  Final diagnoses:  Tonsillitis with exudate       Chinita PesterCari B Berta Denson, FNP 04/01/15 1133  Arnaldo NatalPaul F Malinda, MD 04/01/15 [phone]

## 2015-04-01 NOTE — ED Provider Notes (Signed)
Patient calls back to say that she vomited up the prednisone and the antibiotic. I I told her I will call and Zofran melt on your time 13 times a day as needed for nausea and vomiting. I did indeed call him Zofran ODT 4 mg 13 times a day number for patient is to return for continued vomiting fever or feeling sicker or for any other problems as needed.  Arnaldo NatalPaul F Alexander Mcauley, MD 04/01/15 [phone]

## 2015-04-01 NOTE — ED Notes (Signed)
States she has been feeling bad of about 1 week some nasal congestion  With some discomfort to neck and between shoulder blades

## 2015-07-30 ENCOUNTER — Emergency Department
Admission: EM | Admit: 2015-07-30 | Discharge: 2015-07-30 | Disposition: A | Discharge status: Home / Self Care | Payer: Medicaid Other | Attending: Emergency Medicine | Admitting: Emergency Medicine

## 2015-07-30 ENCOUNTER — Emergency Department: Payer: Medicaid Other

## 2015-07-30 ENCOUNTER — Encounter: Payer: Self-pay | Admitting: *Deleted

## 2015-07-30 DIAGNOSIS — Z79899 Other long term (current) drug therapy: Secondary | ICD-10-CM | POA: Diagnosis not present

## 2015-07-30 DIAGNOSIS — Z7952 Long term (current) use of systemic steroids: Secondary | ICD-10-CM | POA: Diagnosis not present

## 2015-07-30 DIAGNOSIS — R42 Dizziness and giddiness: Secondary | ICD-10-CM | POA: Diagnosis present

## 2015-07-30 DIAGNOSIS — I10 Essential (primary) hypertension: Secondary | ICD-10-CM | POA: Diagnosis not present

## 2015-07-30 MED ORDER — MECLIZINE HCL 25 MG PO TABS
ORAL_TABLET | ORAL | Status: AC
  Filled 2015-07-30: qty 1

## 2015-07-30 MED ORDER — MECLIZINE HCL 25 MG PO TABS: 25.0000 mg | ORAL_TABLET | Freq: Three times a day (TID) | ORAL | Status: DC | PRN

## 2015-07-30 MED ORDER — MECLIZINE HCL 25 MG PO TABS
25.0000 mg | ORAL_TABLET | Freq: Once | ORAL | Status: AC
  Administered 2015-07-30: 25 mg via ORAL

## 2015-07-30 MED ORDER — MECLIZINE HCL 25 MG PO TABS: 25.0000 mg | ORAL_TABLET | Freq: Once | ORAL | Status: DC

## 2015-07-30 NOTE — ED Notes (Signed)
Pt reports having dizziness while lying down today at 16:00 pt reports symptoms resolved after standing up. Pt reports having similar episode again at 17:00. Pt reports small amount of dizziness at this time as well as nausea at this time. Pt reports having left sided ear "pressure" x 1 week. Pt denies vision changes or discharge from ear. Pt A&O x 4 and in NAD at this time.

## 2015-07-30 NOTE — Discharge Instructions (Signed)
Benign Positional Vertigo Vertigo is the feeling that you or your surroundings are moving when they are not. Benign positional vertigo is the most common form of vertigo. The cause of this condition is not serious (is benign). This condition is triggered by certain movements and positions (is positional). This condition can be dangerous if it occurs while you are doing something that could endanger you or others, such as driving.  CAUSES In many cases, the cause of this condition is not known. It may be caused by a disturbance in an area of the inner ear that helps your brain to sense movement and balance. This disturbance can be caused by a viral infection (labyrinthitis), head injury, or repetitive motion. RISK FACTORS This condition is more likely to develop in:  Women.  People who are 50 years of age or older. SYMPTOMS Symptoms of this condition usually happen when you move your head or your eyes in different directions. Symptoms may start suddenly, and they usually last for less than a minute. Symptoms may include:  Loss of balance and falling.  Feeling like you are spinning or moving.  Feeling like your surroundings are spinning or moving.  Nausea and vomiting.  Blurred vision.  Dizziness.  Involuntary eye movement (nystagmus). Symptoms can be mild and cause only slight annoyance, or they can be severe and interfere with daily life. Episodes of benign positional vertigo may return (recur) over time, and they may be triggered by certain movements. Symptoms may improve over time. DIAGNOSIS This condition is usually diagnosed by medical history and a physical exam of the head, neck, and ears. You may be referred to a health care provider who specializes in ear, nose, and throat (ENT) problems (otolaryngologist) or a provider who specializes in disorders of the nervous system (neurologist). You may have additional testing, including:  MRI.  A CT scan.  Eye movement tests. Your  health care provider may ask you to change positions quickly while he or she watches you for symptoms of benign positional vertigo, such as nystagmus. Eye movement may be tested with an electronystagmogram (ENG), caloric stimulation, the Dix-Hallpike test, or the roll test.  An electroencephalogram (EEG). This records electrical activity in your brain.  Hearing tests. TREATMENT Usually, your health care provider will treat this by moving your head in specific positions to adjust your inner ear back to normal. Surgery may be needed in severe cases, but this is rare. In some cases, benign positional vertigo may resolve on its own in 2-4 weeks. HOME CARE INSTRUCTIONS Safety  Move slowly.Avoid sudden body or head movements.  Avoid driving.  Avoid operating heavy machinery.  Avoid doing any tasks that would be dangerous to you or others if a vertigo episode would occur.  If you have trouble walking or keeping your balance, try using a cane for stability. If you feel dizzy or unstable, sit down right away.  Return to your normal activities as told by your health care provider. Ask your health care provider what activities are safe for you. General Instructions  Take over-the-counter and prescription medicines only as told by your health care provider.  Avoid certain positions or movements as told by your health care provider.  Drink enough fluid to keep your urine clear or pale yellow.  Keep all follow-up visits as told by your health care provider. This is important. SEEK MEDICAL CARE IF:  You have a fever.  Your condition gets worse or you develop new symptoms.  Your family or friends   notice any behavioral changes.  Your nausea or vomiting gets worse.  You have numbness or a "pins and needles" sensation. SEEK IMMEDIATE MEDICAL CARE IF:  You have difficulty speaking or moving.  You are always dizzy.  You faint.  You develop severe headaches.  You have weakness in your  legs or arms.  You have changes in your hearing or vision.  You develop a stiff neck.  You develop sensitivity to light.   This information is not intended to replace advice given to you by your health care provider. Make sure you discuss any questions you have with your health care provider.   Document Released: 12/26/2005 Document Revised: 12/09/2014 Document Reviewed: 07/13/2014 Elsevier Interactive Patient Education 2016 [employer] Inc[employer]  

## 2015-07-30 NOTE — ED Provider Notes (Signed)
[facility] Emergency Department Provider Note  ____________________________________________    I have reviewed the triage vital signs and the nursing notes.   HISTORY  Chief Complaint Dizziness    HPI Amanda Hunter is a 30 y.o. female who presents with complaints of dizziness particularly when lying down. She reports symptoms started a proximally 4 PM. She does feel better when she stands up. She is never had this before. She describes the dizziness as room spinning and a sense of movement when nothing is moving. She does report pressure in both of her ears for approximately one week. No pain or discharge. No neuro deficits. No change in vision.     Past Medical History  Diagnosis Date  . Hypertension     There are no active problems to display for this patient.   History reviewed. No pertinent past surgical history.  Current Outpatient Rx  Name  Route  Sig  Dispense  Refill  . amoxicillin (AMOXIL) 500 MG tablet   Oral   Take 1 tablet (500 mg total) by mouth 3 (three) times daily.   30 tablet   0   . meclizine (ANTIVERT) 25 MG tablet   Oral   Take 1 tablet (25 mg total) by mouth 3 (three) times daily as needed for dizziness.   20 tablet   0   . olopatadine (PATANOL) 0.1 % ophthalmic solution   Right Eye   Place 1 drop into the right eye 2 (two) times daily.   5 mL   12   . predniSONE (STERAPRED UNI-PAK 21 TAB) 10 MG (21) TBPK tablet      Take 6 tablets on day 1 Take 5 tablets on day 2 Take 4 tablets on day 3 Take 3 tablets on day 4 Take 2 tablets on day 5 Take 1 tablet on day 6   21 tablet   0     Allergies Review of patient's allergies indicates no known allergies.  History reviewed. No pertinent family history.  Social History Social History  Substance Use Topics  . Smoking status: Never Smoker   . Smokeless tobacco: None  . Alcohol Use: No    Review of Systems  Constitutional: Negative for fever. Eyes:  Negative for blurry vision ENT: Negative for neck pain Cardiovascular: Negative for chest pain Respiratory: Negative for shortness of breath. Gastrointestinal: Negative for abdominal pain Genitourinary: Negative for dysuria. Musculoskeletal: Negative for back pain. Skin: Negative for rash. Neurological: Negative for focal weakness , negative for sensory changes Psychiatric: no anxiety    ____________________________________________   PHYSICAL EXAM:  VITAL SIGNS: ED Triage Vitals  Enc Vitals Group     BP 07/30/15 1823 135/103 mmHg     Pulse Rate 07/30/15 1823 107     Resp 07/30/15 1823 16     Temp 07/30/15 1823 98.6 F (37 C)     Temp Source 07/30/15 1823 Oral     SpO2 07/30/15 1823 99 %     Weight 07/30/15 1823 264 lb (119.75 kg)     Height 07/30/15 1823  (1.626 m)     Head Cir --      Peak Flow --      Pain Score 07/30/15 1824 10     Pain Loc --      Pain Edu? --      Excl. in GC? --      Constitutional: Alert and oriented. Well appearing and in no distress.  Eyes: Conjunctivae are normal. No  erythema or injection, PERRLA, EOMI ENT   Head: Normocephalic and atraumatic.   Mouth/Throat: Mucous membranes are moist. Ears: Normal TMs bilaterally Cardiovascular: Normal rate, regular rhythm. Normal and symmetric distal pulses are present in the upper extremities.  Respiratory: Normal respiratory effort without tachypnea nor retractions. Breath sounds are clear and equal bilaterally.  Gastrointestinal: Soft and non-tender in all quadrants. No distention. There is no CVA tenderness. Genitourinary: deferred Musculoskeletal: Nontender with normal range of motion in all extremities. No lower extremity tenderness nor edema. Neurologic:  Normal speech and language. No gross focal neurologic deficits are appreciated. Skin:  Skin is warm, dry and intact. No rash noted. Psychiatric: Mood and affect are normal. Patient exhibits appropriate insight and  judgment.  ____________________________________________    LABS (pertinent positives/negatives)  Labs Reviewed - No data to display  ____________________________________________   EKG  None  ____________________________________________    RADIOLOGY  CT head unremarkable  ____________________________________________   PROCEDURES  Procedure(s) performed: none  Critical Care performed:none  ____________________________________________   INITIAL IMPRESSION / ASSESSMENT AND PLAN / ED COURSE  Pertinent labs & imaging results that were available during my care of the patient were reviewed by me and considered in my medical decision making (see chart for details).  Patient presents with symptoms of vertigo. We will obtain CT head and give meclizine by mouth.  Patient had mild improvement with meclizine. CT head is unremarkable. No neuro deficits. I suspect this is BPV. I will Rx meclizine and have the patient take symptomatically. She will follow-up with ENT if no improvement.  ____________________________________________   FINAL CLINICAL IMPRESSION(S) / ED DIAGNOSES  Final diagnoses:  Vertigo          Jene Everyobert Anthone Prieur, MD 07/30/15 2256

## 2015-07-30 NOTE — ED Notes (Signed)
Patient transported to CT 

## 2015-08-06 ENCOUNTER — Emergency Department
Admission: EM | Admit: 2015-08-06 | Discharge: 2015-08-06 | Disposition: A | Discharge status: Home / Self Care | Payer: Medicaid Other | Attending: Emergency Medicine | Admitting: Emergency Medicine

## 2015-08-06 ENCOUNTER — Encounter: Payer: Self-pay | Admitting: *Deleted

## 2015-08-06 DIAGNOSIS — M62838 Other muscle spasm: Secondary | ICD-10-CM | POA: Diagnosis not present

## 2015-08-06 DIAGNOSIS — J020 Streptococcal pharyngitis: Secondary | ICD-10-CM | POA: Diagnosis not present

## 2015-08-06 DIAGNOSIS — I10 Essential (primary) hypertension: Secondary | ICD-10-CM | POA: Diagnosis not present

## 2015-08-06 DIAGNOSIS — J029 Acute pharyngitis, unspecified: Secondary | ICD-10-CM | POA: Diagnosis present

## 2015-08-06 DIAGNOSIS — R51 Headache: Secondary | ICD-10-CM | POA: Diagnosis not present

## 2015-08-06 DIAGNOSIS — G44209 Tension-type headache, unspecified, not intractable: Secondary | ICD-10-CM

## 2015-08-06 LAB — POCT RAPID STREP A: STREPTOCOCCUS, GROUP A SCREEN (DIRECT): NEGATIVE

## 2015-08-06 MED ORDER — AMOXICILLIN 500 MG PO CAPS: 500.0000 mg | ORAL_CAPSULE | Freq: Three times a day (TID) | ORAL | Status: DC

## 2015-08-06 MED ORDER — CYCLOBENZAPRINE HCL 5 MG PO TABS: 5.0000 mg | ORAL_TABLET | Freq: Three times a day (TID) | ORAL | Status: DC | PRN

## 2015-08-06 NOTE — ED Notes (Signed)
Patient c/o sore throat. 

## 2015-08-06 NOTE — ED Provider Notes (Signed)
[facility] Emergency Department Provider Note ____________________________________________  Time seen: 1010  I have reviewed the triage vital signs and the nursing notes.  HISTORY  Chief Complaint  Sore Throat  HPI Amanda FinnerKyana Hunter is a 30 y.o. female presents to the ED for evaluation of sudden onset of sore throat yesterday. She does note subjective fevers in the interim. She's been dosing ibuprofen intermittently for both sore throat pain and headache pain. Her other complaint is muscle pain and tension female into the upper trapezius that seems to refer him to the posterior neck. She describes headaches related to this muscle tension pain. She denies any dizziness, weakness, nausea, vomiting. She also denies any visual disturbances or distal paresthesias. She does admit to life stressors secondary to her boyfriend dying several months earlier. She has not been treated by her primary care provider for these symptoms.She reports pain in the neck at a 5/10 in triage. And she describes pain to the throat at a 10/10 in triage.  Past Medical History  Diagnosis Date  . Hypertension     There are no active problems to display for this patient.  History reviewed. No pertinent past surgical history.  Current Outpatient Rx  Name  Route  Sig  Dispense  Refill  . amoxicillin (AMOXIL) 500 MG capsule   Oral   Take 1 capsule (500 mg total) by mouth 3 (three) times daily.   30 capsule   0   . cyclobenzaprine (FLEXERIL) 5 MG tablet   Oral   Take 1 tablet (5 mg total) by mouth every 8 (eight) hours as needed for muscle spasms.   12 tablet   0   . meclizine (ANTIVERT) 25 MG tablet   Oral   Take 1 tablet (25 mg total) by mouth 3 (three) times daily as needed for dizziness.   20 tablet   0   . olopatadine (PATANOL) 0.1 % ophthalmic solution   Right Eye   Place 1 drop into the right eye 2 (two) times daily.   5 mL   12   . predniSONE (STERAPRED UNI-PAK 21 TAB) 10 MG  (21) TBPK tablet      Take 6 tablets on day 1 Take 5 tablets on day 2 Take 4 tablets on day 3 Take 3 tablets on day 4 Take 2 tablets on day 5 Take 1 tablet on day 6   21 tablet   0    Allergies Review of patient's allergies indicates no known allergies.  History reviewed. No pertinent family history.  Social History Social History  Substance Use Topics  . Smoking status: Never Smoker   . Smokeless tobacco: None  . Alcohol Use: No   Review of Systems  Constitutional: Positive for subjective fever. Eyes: Negative for visual changes. ENT: Positive for sore throat. Musculoskeletal: Negative for back pain. Reports neck pain and spasms.  Skin: Negative for rash. Neurological: Negative for focal weakness or numbness. Positive for headaches. ____________________________________________  PHYSICAL EXAM:  VITAL SIGNS: ED Triage Vitals  Enc Vitals Group     BP 08/06/15 0925 135/80 mmHg     Pulse Rate 08/06/15 0925 83     Resp 08/06/15 0925 18     Temp 08/06/15 0925 98.3 F (36.8 C)     Temp Source 08/06/15 0925 Oral     SpO2 08/06/15 0925 100 %     Weight 08/06/15 0925 264 lb (119.75 kg)     Height 08/06/15 0925 5\' 4"  (1.626 m)  Head Cir --      Peak Flow --      Pain Score 08/06/15 0925 5     Pain Loc --      Pain Edu? --      Excl. in GC? --    Constitutional: Alert and oriented. Well appearing and in no distress. Head: Normocephalic and atraumatic.      Eyes: Conjunctivae are normal. PERRL. Normal extraocular movements      Ears: Canals clear. TMs intact bilaterally.   Nose: No congestion/rhinorrhea.   Mouth/Throat: Mucous membranes are moist.Uvula is midline and tonsils are enlarged, erythematous, and injected. There is exudate noted bilaterally.   Neck: Supple. No thyromegaly. Hematological/Lymphatic/Immunological: No cervical lymphadenopathy. Cardiovascular: Normal rate, regular rhythm.  Respiratory: Normal respiratory effort. No  wheezes/rales/rhonchi. Gastrointestinal: Soft and nontender. No distention. Musculoskeletal: Patient with normal spinal alignment without midline tenderness, spasm, deformity, step-off. Normal range of motion in all extremities.  Neurologic:  Cranial nerves II through XII grossly intact. Normal gait without ataxia. Normal speech and language. No gross focal neurologic deficits are appreciated. Skin:  Skin is warm, dry and intact. No rash noted. _______________________________________________________________  LABORATORY Labs Reviewed  POCT RAPID STREP A  ____________________________________________  INITIAL IMPRESSION / ASSESSMENT AND PLAN / ED COURSE  Patient with a negative rapid strep, with exam that is highly suspicious for an acute strep tonsillitis. She will be discharged with a prescription for amoxicillin given her history of previous and recurrent strep tonsillitis. She also has a presentation consistent with muscle tension and likely tension headache. She will be discharged with a prescription for cyclobenzaprine to dose in addition to over-the-counter anti-inflammatories. She is to follow with primary care provider for ongoing symptom management. ____________________________________________  FINAL CLINICAL IMPRESSION(S) / ED DIAGNOSES  Final diagnoses:  Strep sore throat  Muscle spasms of neck  Tension headache      Lissa Hoard, PA-C 08/09/15 0005  Rockne Menghini, MD 08/16/15 2323

## 2015-08-06 NOTE — Discharge Instructions (Signed)
Strep Throat °Strep throat is a bacterial infection of the throat. Your health care provider may call the infection tonsillitis or pharyngitis, depending on whether there is swelling in the tonsils or at the back of the throat. Strep throat is most common during the cold months of the year in children who are 5-28 years of age, but it can happen during any season in people of any age. This infection is spread from person to person (contagious) through coughing, sneezing, or close contact. °CAUSES °Strep throat is caused by the bacteria called Streptococcus pyogenes. °RISK FACTORS °This condition is more likely to develop in: °· People who spend time in crowded places where the infection can spread easily. °· People who have close contact with someone who has strep throat. °SYMPTOMS °Symptoms of this condition include: °· Fever or chills.   °· Redness, swelling, or pain in the tonsils or throat. °· Pain or difficulty when swallowing. °· White or yellow spots on the tonsils or throat. °· Swollen, tender glands in the neck or under the jaw. °· Red rash all over the body (rare). °DIAGNOSIS °This condition is diagnosed by performing a rapid strep test or by taking a swab of your throat (throat culture test). Results from a rapid strep test are usually ready in a few minutes, but throat culture test results are available after one or two days. °TREATMENT °This condition is treated with antibiotic medicine. °HOME CARE INSTRUCTIONS °Medicines °· Take over-the-counter and prescription medicines only as told by your health care provider. °· Take your antibiotic as told by your health care provider. Do not stop taking the antibiotic even if you start to feel better. °· Have family members who also have a sore throat or fever tested for strep throat. They may need antibiotics if they have the strep infection. °Eating and Drinking °· Do not share food, drinking cups, or personal items that could cause the infection to spread to  other people. °· If swallowing is difficult, try eating soft foods until your sore throat feels better. °· Drink enough fluid to keep your urine clear or pale yellow. °General Instructions °· Gargle with a salt-water mixture 3-4 times per day or as needed. To make a salt-water mixture, completely dissolve ½-1 tsp of salt in 1 cup of warm water. °· Make sure that all household members wash their hands well. °· Get plenty of rest. °· Stay home from school or work until you have been taking antibiotics for 24 hours. °· Keep all follow-up visits as told by your health care provider. This is important. °SEEK MEDICAL CARE IF: °· The glands in your neck continue to get bigger. °· You develop a rash, cough, or earache. °· You cough up a thick liquid that is green, yellow-brown, or bloody. °· You have pain or discomfort that does not get better with medicine. °· Your problems seem to be getting worse rather than better. °· You have a fever. °SEEK IMMEDIATE MEDICAL CARE IF: °· You have new symptoms, such as vomiting, severe headache, stiff or painful neck, chest pain, or shortness of breath. °· You have severe throat pain, drooling, or changes in your voice. °· You have swelling of the neck, or the skin on the neck becomes red and tender. °· You have signs of dehydration, such as fatigue, dry mouth, and decreased urination. °· You become increasingly sleepy, or you cannot wake up completely. °· Your joints become red or painful. °  °This information is not intended to replace   advice given to you by your health care provider. Make sure you discuss any questions you have with your health care provider.   Document Released: 03/17/2000 Document Revised: 12/09/2014 Document Reviewed: 07/13/2014 Elsevier Interactive Patient Education 2016 Elsevier Inc.  Tension Headache A tension headache is a feeling of pain, pressure, or aching that is often felt over the front and sides of the head. The pain can be dull, or it can feel  tight (constricting). Tension headaches are not normally associated with nausea or vomiting, and they do not get worse with physical activity. Tension headaches can last from 30 minutes to several days. This is the most common type of headache. CAUSES The exact cause of this condition is not known. Tension headaches often begin after stress, anxiety, or depression. Other triggers may include:  Alcohol.  Too much caffeine, or caffeine withdrawal.  Respiratory infections, such as colds, flu, or sinus infections.  Dental problems or teeth clenching.  Fatigue.  Holding your head and neck in the same position for a long period of time, such as while using a computer.  Smoking. SYMPTOMS Symptoms of this condition include:  A feeling of pressure around the head.  Dull, aching head pain.  Pain felt over the front and sides of the head.  Tenderness in the muscles of the head, neck, and shoulders. DIAGNOSIS This condition may be diagnosed based on your symptoms and a physical exam. Tests may be done, such as a CT scan or an MRI of your head. These tests may be done if your symptoms are severe or unusual. TREATMENT This condition may be treated with lifestyle changes and medicines to help relieve symptoms. HOME CARE INSTRUCTIONS Managing Pain  Take over-the-counter and prescription medicines only as told by your health care provider.  Lie down in a dark, quiet room when you have a headache.  If directed, apply ice to the head and neck area:  Put ice in a plastic bag.  Place a towel between your skin and the bag.  Leave the ice on for 20 minutes, 2-3 times per day.  Use a heating pad or a hot shower to apply heat to the head and neck area as told by your health care provider. Eating and Drinking  Eat meals on a regular schedule.  Limit alcohol use.  Decrease your caffeine intake, or stop using caffeine. General Instructions  Keep all follow-up visits as told by your health  care provider. This is important.  Keep a headache journal to help find out what may trigger your headaches. For example, write down:  What you eat and drink.  How much sleep you get.  Any change to your diet or medicines.  Try massage or other relaxation techniques.  Limit stress.  Sit up straight, and avoid tensing your muscles.  Do not use tobacco products, including cigarettes, chewing tobacco, or e-cigarettes. If you need help quitting, ask your health care provider.  Exercise regularly as told by your health care provider.  Get 7-9 hours of sleep, or the amount recommended by your health care provider. SEEK MEDICAL CARE IF:  Your symptoms are not helped by medicine.  You have a headache that is different from what you normally experience.  You have nausea or you vomit.  You have a fever. SEEK IMMEDIATE MEDICAL CARE IF:  Your headache becomes severe.  You have repeated vomiting.  You have a stiff neck.  You have a loss of vision.  You have problems with speech.  You have pain in your eye or ear.  You have muscular weakness or loss of muscle control.  You lose your balance or you have trouble walking.  You feel faint or you pass out.  You have confusion.   This information is not intended to replace advice given to you by your health care provider. Make sure you discuss any questions you have with your health care provider.   Document Released: 03/20/2005 Document Revised: 12/09/2014 Document Reviewed: 07/13/2014 Elsevier Interactive Patient Education [employer]  Take the prescription meds as directed. Follow-up with your provider for continued symptoms. Change you toothbrush after 24-hours.

## 2016-02-17 ENCOUNTER — Emergency Department
Admission: EM | Admit: 2016-02-17 | Discharge: 2016-02-17 | Disposition: A | Discharge status: Home / Self Care | Payer: Medicaid Other | Attending: Emergency Medicine | Admitting: Emergency Medicine

## 2016-02-17 ENCOUNTER — Encounter: Payer: Self-pay | Admitting: Emergency Medicine

## 2016-02-17 DIAGNOSIS — I10 Essential (primary) hypertension: Secondary | ICD-10-CM | POA: Insufficient documentation

## 2016-02-17 DIAGNOSIS — Z79899 Other long term (current) drug therapy: Secondary | ICD-10-CM | POA: Diagnosis not present

## 2016-02-17 DIAGNOSIS — H00021 Hordeolum internum right upper eyelid: Secondary | ICD-10-CM | POA: Diagnosis not present

## 2016-02-17 DIAGNOSIS — H5711 Ocular pain, right eye: Secondary | ICD-10-CM | POA: Diagnosis present

## 2016-02-17 MED ORDER — GENTAMICIN SULFATE 0.3 % OP SOLN: 1.0000 [drp] | OPHTHALMIC | 0 refills | Status: DC

## 2016-02-17 MED ORDER — TRAMADOL HCL 50 MG PO TABS: 50.0000 mg | ORAL_TABLET | Freq: Four times a day (QID) | ORAL | 0 refills | Status: DC | PRN

## 2016-02-17 NOTE — Discharge Instructions (Signed)
Apply warm compresses 3 times a day for approximately 5 minutes.

## 2016-02-17 NOTE — ED Notes (Signed)
Pain to right eye for about 2 days   W/o injury  Also having some blurred visoin

## 2016-02-17 NOTE — ED Triage Notes (Signed)
Pt reports right eye pain for three days. Denies any other sx. NAD.

## 2016-02-17 NOTE — ED Notes (Addendum)
20/30 in both eyes no corrective lenses

## 2016-02-17 NOTE — ED Provider Notes (Signed)
[facility] Emergency Department Provider Note    ____________________________________________   First MD Initiated Contact with Patient 02/17/16 1228     (approximate)  I have reviewed the triage vital signs and the nursing notes.   HISTORY  Chief Complaint Eye Pain    HPI Junius FinnerKyana Welp is a 30 y.o. female patient complaining of right eye pain for 2 days. Patient denies any provocative incident for this complaint. Patient states mild blurry vision. Patient denies matted eyelid or drainage from the eye.Patient rates the pain as a 10 over 10. No palliative measures taken for this complaint. Patient did not wear contact or corrective lenses. Patient also state is a mild headache secondary to the high pain.   Past Medical History:  Diagnosis Date  . Hypertension     There are no active problems to display for this patient.   History reviewed. No pertinent surgical history.  Prior to Admission medications   Medication Sig Start Date End Date Taking? Authorizing Provider  amoxicillin (AMOXIL) 500 MG capsule Take 1 capsule (500 mg total) by mouth 3 (three) times daily. 08/06/15   Jenise V Bacon Menshew, PA-C  cyclobenzaprine (FLEXERIL) 5 MG tablet Take 1 tablet (5 mg total) by mouth every 8 (eight) hours as needed for muscle spasms. 08/06/15   Jenise V Bacon Menshew, PA-C  gentamicin (GARAMYCIN) 0.3 % ophthalmic solution Place 1 drop into the right eye every 4 (four) hours. 02/17/16   Joni Reiningonald K Smith, PA-C  meclizine (ANTIVERT) 25 MG tablet Take 1 tablet (25 mg total) by mouth 3 (three) times daily as needed for dizziness. 07/30/15   Jene Everyobert Kinner, MD  olopatadine (PATANOL) 0.1 % ophthalmic solution Place 1 drop into the right eye 2 (two) times daily. 12/29/14   Joni Reiningonald K Smith, PA-C  predniSONE (STERAPRED UNI-PAK 21 TAB) 10 MG (21) TBPK tablet Take 6 tablets on day 1 Take 5 tablets on day 2 Take 4 tablets on day 3 Take 3 tablets on day 4 Take 2 tablets on day  5 Take 1 tablet on day 6 04/01/15   Chinita Pesterari B Triplett, FNP  traMADol (ULTRAM) 50 MG tablet Take 1 tablet (50 mg total) by mouth every 6 (six) hours as needed for moderate pain. 02/17/16   Joni Reiningonald K Smith, PA-C    Allergies Patient has no known allergies.  No family history on file.  Social History Social History  Substance Use Topics  . Smoking status: Never Smoker  . Smokeless tobacco: Never Used  . Alcohol use No    Review of Systems Constitutional: No fever/chills Eyes: No visual changes.Right eye pain ENT: No sore throat. Cardiovascular: Denies chest pain. Respiratory: Denies shortness of breath. Gastrointestinal: No abdominal pain.  No nausea, no vomiting.  No diarrhea.  No constipation. Genitourinary: Negative for dysuria. Musculoskeletal: Negative for back pain. Skin: Negative for rash. Neurological: Negative for headaches, focal weakness or numbness.  10-point ROS otherwise negative.  ____________________________________________   PHYSICAL EXAM:  VITAL SIGNS: ED Triage Vitals  Enc Vitals Group     BP 02/17/16 1157 134/81     Pulse Rate 02/17/16 1157 (!) 101     Resp 02/17/16 1157 18     Temp 02/17/16 1157 98.7 F (37.1 C)     Temp Source 02/17/16 1157 Oral     SpO2 02/17/16 1157 99 %     Weight 02/17/16 1158 264 lb (119.7 kg)     Height --      Head Circumference --  Peak Flow --      Pain Score 02/17/16 1158 10     Pain Loc --      Pain Edu? --      Excl. in GC? --     Constitutional: Alert and oriented. Well appearing and in no acute distress. Eyes: Conjunctivae are normal. PERRL. EOMI. Papular lesion internal upper eyelid. Head: Atraumatic. Nose: No congestion/rhinnorhea. Mouth/Throat: Mucous membranes are moist.  Oropharynx non-erythematous. Neck: No stridor.  No cervical spine tenderness to palpation. Hematological/Lymphatic/Immunilogical: No cervical lymphadenopathy. Cardiovascular: Normal rate, regular rhythm. Grossly normal heart  sounds.  Good peripheral circulation. Respiratory: Normal respiratory effort.  No retractions. Lungs CTAB. Gastrointestinal: Soft and nontender. No distention. No abdominal bruits. No CVA tenderness. Musculoskeletal: No lower extremity tenderness nor edema.  No joint effusions. Neurologic:  Normal speech and language. No gross focal neurologic deficits are appreciated. No gait instability. Skin:  Skin is warm, dry and intact. No rash noted. Psychiatric: Mood and affect are normal. Speech and behavior are normal.  ____________________________________________   LABS (all labs ordered are listed, but only abnormal results are displayed)  Labs Reviewed - No data to display ____________________________________________  EKG   ____________________________________________  RADIOLOGY   ____________________________________________   PROCEDURES  Procedure(s) performed: None  Procedures  Critical Care performed: No  ____________________________________________   INITIAL IMPRESSION / ASSESSMENT AND PLAN / ED COURSE  Pertinent labs & imaging results that were available during my care of the patient were reviewed by me and considered in my medical decision making (see chart for details).  Internal*right upper eyelid. Patient given discharge Instructions. Patient given prescription for gentamicin. Patient advised to follow-up with [facility] if no improvement in 3-5 days.  Clinical Course      ____________________________________________   FINAL CLINICAL IMPRESSION(S) / ED DIAGNOSES  Final diagnoses:  Hordeolum internum of right upper eyelid      NEW MEDICATIONS STARTED DURING THIS VISIT:  New Prescriptions   GENTAMICIN (GARAMYCIN) 0.3 % OPHTHALMIC SOLUTION    Place 1 drop into the right eye every 4 (four) hours.   TRAMADOL (ULTRAM) 50 MG TABLET    Take 1 tablet (50 mg total) by mouth every 6 (six) hours as needed for moderate pain.     Note:  This  document was prepared using Dragon voice recognition software and may include unintentional dictation errors.    Joni Reiningonald K Smith, PA-C 02/17/16 1246    Emily FilbertJonathan E Williams, MD 02/17/16 [phone]

## 2016-05-04 ENCOUNTER — Emergency Department
Admission: EM | Admit: 2016-05-04 | Discharge: 2016-05-04 | Disposition: A | Discharge status: Home / Self Care | Payer: Medicaid Other | Attending: Student in an Organized Health Care Education/Training Program | Admitting: Student in an Organized Health Care Education/Training Program

## 2016-05-04 ENCOUNTER — Encounter: Payer: Self-pay | Admitting: Emergency Medicine

## 2016-05-04 DIAGNOSIS — I10 Essential (primary) hypertension: Secondary | ICD-10-CM | POA: Diagnosis not present

## 2016-05-04 DIAGNOSIS — Z79899 Other long term (current) drug therapy: Secondary | ICD-10-CM | POA: Insufficient documentation

## 2016-05-04 DIAGNOSIS — G44209 Tension-type headache, unspecified, not intractable: Secondary | ICD-10-CM | POA: Insufficient documentation

## 2016-05-04 DIAGNOSIS — R51 Headache: Secondary | ICD-10-CM | POA: Diagnosis present

## 2016-05-04 MED ORDER — NABUMETONE 750 MG PO TABS: 750.0000 mg | ORAL_TABLET | Freq: Two times a day (BID) | ORAL | 0 refills | Status: DC

## 2016-05-04 NOTE — ED Triage Notes (Signed)
Pt woke from sleep last night with pressure "behind her right eye" and she says Tylenol does not touch the pain.  She also reports vision changes when the HA is at its worst.  Pt in NAD at this time but reports 9/10 pain.

## 2016-05-04 NOTE — Discharge Instructions (Signed)
Take the prescription anti-inflammatory pain medicine as directed for headache pain relief. Follow-up with your provider for further management as discussed.

## 2016-05-07 NOTE — ED Provider Notes (Signed)
[facility] Emergency Department Provider Note ____________________________________________  Time seen: 2220  I have reviewed the triage vital signs and the nursing notes.  HISTORY  Chief Complaint  Headache  HPI Amanda Hunter is a 31 y.o. female presents to the ED, accompanied by her young daughter for evaluation of intermittent headaches. Patient describes the headache pattern over the last 2 weeks, noting pressure to the bilateral temples. She also reports some pressure behind the eyes. She denies any nausea, vomiting, light sensitivity, photophobia, distal weakness. She has noticed some intermittent lightheadedness. She has dosed Tylenol for headache pain but denies any lasting benefit.She also was to some concerns over possible thyroid disease. He states that her husband died about 3 years ago this month due to complications of thyroid disease. She reports good sleep denies any unintended weight loss or weight gain, and denies any chest pain, shortness breath, or palpitations.  Past Medical History:  Diagnosis Date  . Hypertension     There are no active problems to display for this patient.  History reviewed. No pertinent surgical history.  Prior to Admission medications   Medication Sig Start Date End Date Taking? Authorizing Provider  amoxicillin (AMOXIL) 500 MG capsule Take 1 capsule (500 mg total) by mouth 3 (three) times daily. 08/06/15   Zaynah Chawla V Bacon Keithan Dileonardo, PA-C  cyclobenzaprine (FLEXERIL) 5 MG tablet Take 1 tablet (5 mg total) by mouth every 8 (eight) hours as needed for muscle spasms. 08/06/15   Gayanne Prescott V Bacon Ameri Cahoon, PA-C  gentamicin (GARAMYCIN) 0.3 % ophthalmic solution Place 1 drop into the right eye every 4 (four) hours. 02/17/16   Joni Reiningonald K Smith, PA-C  meclizine (ANTIVERT) 25 MG tablet Take 1 tablet (25 mg total) by mouth 3 (three) times daily as needed for dizziness. 07/30/15   Jene Everyobert Kinner, MD  nabumetone (RELAFEN) 750 MG tablet Take 1  tablet (750 mg total) by mouth 2 (two) times daily. 05/04/16   Chalet Kerwin V Bacon Kadyn Chovan, PA-C  olopatadine (PATANOL) 0.1 % ophthalmic solution Place 1 drop into the right eye 2 (two) times daily. 12/29/14   Joni Reiningonald K Smith, PA-C  predniSONE (STERAPRED UNI-PAK 21 TAB) 10 MG (21) TBPK tablet Take 6 tablets on day 1 Take 5 tablets on day 2 Take 4 tablets on day 3 Take 3 tablets on day 4 Take 2 tablets on day 5 Take 1 tablet on day 6 04/01/15   Chinita Pesterari B Triplett, FNP  traMADol (ULTRAM) 50 MG tablet Take 1 tablet (50 mg total) by mouth every 6 (six) hours as needed for moderate pain. 02/17/16   Joni Reiningonald K Smith, PA-C    Allergies Patient has no known allergies.  History reviewed. No pertinent family history.  Social History Social History  Substance Use Topics  . Smoking status: Never Smoker  . Smokeless tobacco: Never Used  . Alcohol use No    Review of Systems  Constitutional: Negative for fever. Eyes: Negative for visual changes. ENT: Negative for sore throat. Cardiovascular: Negative for chest pain. Respiratory: Negative for shortness of breath. Gastrointestinal: Negative for abdominal pain, vomiting and diarrhea. Neurological: Negative for focal weakness or numbness. Reports headache as above.  ____________________________________________  PHYSICAL EXAM:  VITAL SIGNS: ED Triage Vitals  Enc Vitals Group     BP 05/04/16 2133 125/75     Pulse Rate 05/04/16 2133 (!) 103     Resp 05/04/16 2133 16     Temp 05/04/16 2133 98.2 F (36.8 C)     Temp Source 05/04/16  2133 Oral     SpO2 05/04/16 2133 100 %     Weight 05/04/16 2120 280 lb (127 kg)     Height 05/04/16 2120 5\' 4"  (1.626 m)     Head Circumference --      Peak Flow --      Pain Score --      Pain Loc --      Pain Edu? --      Excl. in GC? --     Constitutional: Alert and oriented. Well appearing and in no distress. Head: Normocephalic and atraumatic. Eyes: Conjunctivae are normal. PERRL. Normal extraocular  movements Ears: Canals clear. TMs intact bilaterally. Nose: No congestion/rhinorrhea/epistaxis. Mouth/Throat: Mucous membranes are moist. Neck: Supple. No thyromegaly. Hematological/Lymphatic/Immunological: No cervical lymphadenopathy. Cardiovascular: Normal rate, regular rhythm. Normal distal pulses. Respiratory: Normal respiratory effort. No wheezes/rales/rhonchi. Gastrointestinal: Soft and nontender. No distention. Musculoskeletal: Nontender with normal range of motion in all extremities.  Neurologic: Cranial nerves II through XII grossly intact. Normal gait without ataxia. Normal speech and language. No gross focal neurologic deficits are appreciated. Skin:  Skin is warm, dry and intact. No rash noted. Psychiatric: Mood and affect are normal. Patient exhibits appropriate insight and judgment. ____________________________________________  INITIAL IMPRESSION / ASSESSMENT AND PLAN / ED COURSE  Patient with a benign exam that likely represents tension headache. Patient has the addition of some recent stress triggers, including the anniversary of the death of her husband. She is advised this time to follow-up with primary care provider for further counseling and testing as necessary. She is discharged from the ED with a perception for nabumetone 2 doses directed for headache pain. She is also advised she may further dose Benadryl at home for headache management and sleep induction. She should return to ED as needed for acutely worsening symptoms. ____________________________________________  FINAL CLINICAL IMPRESSION(S) / ED DIAGNOSES  Final diagnoses:  Acute non intractable tension-type headache      Lissa Hoard, PA-C 05/07/16 1038    Willy Eddy, MD 05/09/16 1002

## 2016-06-13 ENCOUNTER — Encounter: Payer: Self-pay | Admitting: Emergency Medicine

## 2016-06-13 ENCOUNTER — Emergency Department
Admission: EM | Admit: 2016-06-13 | Discharge: 2016-06-13 | Disposition: A | Discharge status: Home / Self Care | Payer: Medicaid Other | Attending: Emergency Medicine | Admitting: Emergency Medicine

## 2016-06-13 DIAGNOSIS — I10 Essential (primary) hypertension: Secondary | ICD-10-CM | POA: Insufficient documentation

## 2016-06-13 DIAGNOSIS — B349 Viral infection, unspecified: Secondary | ICD-10-CM | POA: Insufficient documentation

## 2016-06-13 DIAGNOSIS — J029 Acute pharyngitis, unspecified: Secondary | ICD-10-CM

## 2016-06-13 DIAGNOSIS — R0981 Nasal congestion: Secondary | ICD-10-CM

## 2016-06-13 MED ORDER — PSEUDOEPH-BROMPHEN-DM 30-2-10 MG/5ML PO SYRP: 5.0000 mL | ORAL_SOLUTION | Freq: Four times a day (QID) | ORAL | 0 refills | Status: DC | PRN

## 2016-06-13 MED ORDER — FIRST-DUKES MOUTHWASH MT SUSP: 10.0000 mL | Freq: Four times a day (QID) | OROMUCOSAL | 0 refills | Status: DC

## 2016-06-13 MED ORDER — LIDOCAINE VISCOUS 2 % MT SOLN: 5.0000 mL | Freq: Four times a day (QID) | OROMUCOSAL | 0 refills | Status: DC | PRN

## 2016-06-13 NOTE — ED Notes (Signed)
See triage note  States she ha shad nasal congestion with sore throat for about 3 days   afebrile on arrival

## 2016-06-13 NOTE — ED Notes (Signed)

## 2016-06-13 NOTE — Discharge Instructions (Signed)
Advised to increase fluid intake and take medication as directed.

## 2016-06-13 NOTE — ED Provider Notes (Signed)
[facility] Emergency Department Provider Note   ____________________________________________   First MD Initiated Contact with Patient 06/13/16 1232     (approximate)  I have reviewed the triage vital signs and the nursing notes.   HISTORY  Chief Complaint Nasal Congestion    HPI Amanda Hunter is a 31 y.o. female patient complaining of nasal congestion sore throat for 3 days. Patient denies any fever this complaint. Patient is also nonproductive cough.No palliative measures for complaint. Patient rates pain as 10 over 10. Patient denies nausea, vomiting, or diarrhea.   Past Medical History:  Diagnosis Date  . Hypertension     There are no active problems to display for this patient.   History reviewed. No pertinent surgical history.  Prior to Admission medications   Medication Sig Start Date End Date Taking? Authorizing Provider  amoxicillin (AMOXIL) 500 MG capsule Take 1 capsule (500 mg total) by mouth 3 (three) times daily. 08/06/15   Jenise V Bacon Menshew, PA-C  brompheniramine-pseudoephedrine-DM 30-2-10 MG/5ML syrup Take 5 mLs by mouth 4 (four) times daily as needed. 06/13/16   Joni Reining, PA-C  cyclobenzaprine (FLEXERIL) 5 MG tablet Take 1 tablet (5 mg total) by mouth every 8 (eight) hours as needed for muscle spasms. 08/06/15   Jenise V Bacon Menshew, PA-C  Diphenhyd-Hydrocort-Nystatin (FIRST-DUKES MOUTHWASH) SUSP Use as directed 10 mLs in the mouth or throat 4 (four) times daily. 06/13/16   Joni Reining, PA-C  gentamicin (GARAMYCIN) 0.3 % ophthalmic solution Place 1 drop into the right eye every 4 (four) hours. 02/17/16   Joni Reining, PA-C  lidocaine (XYLOCAINE) 2 % solution Use as directed 5 mLs in the mouth or throat every 6 (six) hours as needed for mouth pain. mix with mouthwash for swish and swallow. 06/13/16   Joni Reining, PA-C  meclizine (ANTIVERT) 25 MG tablet Take 1 tablet (25 mg total) by mouth 3 (three) times daily as needed  for dizziness. 07/30/15   Jene Every, MD  nabumetone (RELAFEN) 750 MG tablet Take 1 tablet (750 mg total) by mouth 2 (two) times daily. 05/04/16   Jenise V Bacon Menshew, PA-C  olopatadine (PATANOL) 0.1 % ophthalmic solution Place 1 drop into the right eye 2 (two) times daily. 12/29/14   Joni Reining, PA-C  predniSONE (STERAPRED UNI-PAK 21 TAB) 10 MG (21) TBPK tablet Take 6 tablets on day 1 Take 5 tablets on day 2 Take 4 tablets on day 3 Take 3 tablets on day 4 Take 2 tablets on day 5 Take 1 tablet on day 6 04/01/15   Chinita Pester, FNP  traMADol (ULTRAM) 50 MG tablet Take 1 tablet (50 mg total) by mouth every 6 (six) hours as needed for moderate pain. 02/17/16   Joni Reining, PA-C    Allergies Patient has no known allergies.  No family history on file.  Social History Social History  Substance Use Topics  . Smoking status: Never Smoker  . Smokeless tobacco: Never Used  . Alcohol use No    Review of Systems Constitutional: No fever/chills Eyes: No visual changes. ENT: No sore throat. Cardiovascular: Denies chest pain. Respiratory: Denies shortness of breath. Gastrointestinal: No abdominal pain.  No nausea, no vomiting.  No diarrhea.  No constipation. Genitourinary: Negative for dysuria. Musculoskeletal: Negative for back pain. Skin: Negative for rash. Neurological: Negative for headaches, focal weakness or numbness. Endocrine:Hypertension ____________________________________________   PHYSICAL EXAM:  VITAL SIGNS: ED Triage Vitals  Enc Vitals Group  BP 06/13/16 1204 (!) 149/92     Pulse Rate 06/13/16 1204 (!) 119     Resp 06/13/16 1204 17     Temp 06/13/16 1204 98 F (36.7 C)     Temp Source 06/13/16 1204 Oral     SpO2 06/13/16 1204 96 %     Weight 06/13/16 1205 280 lb (127 kg)     Height 06/13/16 1205 5\' 4"  (1.626 m)     Head Circumference --      Peak Flow --      Pain Score 06/13/16 1205 10     Pain Loc --      Pain Edu? --      Excl. in GC? --       Constitutional: Alert and oriented. Well appearing and in no acute distress. Eyes: Conjunctivae are normal. PERRL. EOMI. Head: Atraumatic. Nose: No congestion/rhinnorhea. Mouth/Throat: Mucous membranes are moist.  Oropharynx erythematous.  Neck: No stridor.  No cervical spine tenderness to palpation. Hematological/Lymphatic/Immunilogical: No cervical lymphadenopathy. Cardiovascular: Tachycardic. Grossly normal heart sounds.  Good peripheral circulation. Respiratory: Normal respiratory effort.  No retractions. Lungs CTAB. Gastrointestinal: Soft and nontender. No distention. No abdominal bruits. No CVA tenderness. Musculoskeletal: No lower extremity tenderness nor edema.  No joint effusions. Neurologic:  Normal speech and language. No gross focal neurologic deficits are appreciated. No gait instability. Skin:  Skin is warm, dry and intact. No rash noted. Psychiatric: Mood and affect are normal. Speech and behavior are normal.  ____________________________________________   LABS (all labs ordered are listed, but only abnormal results are displayed)  Labs Reviewed - No data to display ____________________________________________  EKG   ____________________________________________  RADIOLOGY   ____________________________________________   PROCEDURES  Procedure(s) performed: None  Procedures  Critical Care performed: No  ____________________________________________   INITIAL IMPRESSION / ASSESSMENT AND PLAN / ED COURSE  Pertinent labs & imaging results that were available during my care of the patient were reviewed by me and considered in my medical decision making (see chart for details).  Viral illness. Patient given discharge care instructions. Patient given a prescription for Bromfed DM,Duke mouthwash, and viscous lidocaine. Patient advised to increase water intake.      ____________________________________________   FINAL CLINICAL IMPRESSION(S) / ED  DIAGNOSES  Final diagnoses:  Nasal congestion  Pharyngitis with viral syndrome      NEW MEDICATIONS STARTED DURING THIS VISIT:  New Prescriptions   BROMPHENIRAMINE-PSEUDOEPHEDRINE-DM 30-2-10 MG/5ML SYRUP    Take 5 mLs by mouth 4 (four) times daily as needed.   DIPHENHYD-HYDROCORT-NYSTATIN (FIRST-DUKES MOUTHWASH) SUSP    Use as directed 10 mLs in the mouth or throat 4 (four) times daily.   LIDOCAINE (XYLOCAINE) 2 % SOLUTION    Use as directed 5 mLs in the mouth or throat every 6 (six) hours as needed for mouth pain. mix with mouthwash for swish and swallow.     Note:  This document was prepared using Dragon voice recognition software and may include unintentional dictation errors.    Joni ReiningRonald K Clerance Umland, PA-C 06/13/16 1248    Merrily BrittleNeil Rifenbark, MD 06/13/16 1258

## 2016-06-13 NOTE — ED Triage Notes (Signed)
Patient presents to ED via POV with c/o nasal congestion and sore throat x3 days.

## 2016-06-22 ENCOUNTER — Emergency Department: Payer: Medicaid Other

## 2016-06-22 ENCOUNTER — Encounter: Payer: Self-pay | Admitting: Emergency Medicine

## 2016-06-22 ENCOUNTER — Emergency Department
Admission: EM | Admit: 2016-06-22 | Discharge: 2016-06-22 | Disposition: A | Discharge status: Home / Self Care | Payer: Medicaid Other | Attending: Emergency Medicine | Admitting: Emergency Medicine

## 2016-06-22 DIAGNOSIS — M940 Chondrocostal junction syndrome [Tietze]: Secondary | ICD-10-CM | POA: Diagnosis not present

## 2016-06-22 DIAGNOSIS — Z79899 Other long term (current) drug therapy: Secondary | ICD-10-CM | POA: Diagnosis not present

## 2016-06-22 DIAGNOSIS — K2970 Gastritis, unspecified, without bleeding: Secondary | ICD-10-CM | POA: Diagnosis not present

## 2016-06-22 DIAGNOSIS — I10 Essential (primary) hypertension: Secondary | ICD-10-CM | POA: Insufficient documentation

## 2016-06-22 DIAGNOSIS — R0602 Shortness of breath: Secondary | ICD-10-CM | POA: Diagnosis present

## 2016-06-22 MED ORDER — NAPROXEN 500 MG PO TABS: 500.0000 mg | ORAL_TABLET | Freq: Two times a day (BID) | ORAL | Status: DC

## 2016-06-22 NOTE — ED Triage Notes (Signed)
Pt with complaint of difficulty breathing, no signs of distress, skin warm and dry.

## 2016-06-22 NOTE — ED Notes (Signed)

## 2016-06-22 NOTE — ED Provider Notes (Signed)
[facility] Emergency Department Provider Note   ____________________________________________   None    (approximate)  I have reviewed the triage vital signs and the nursing notes.   HISTORY  Chief Complaint Shortness of Breath    HPI Amanda Hunter is a 31 y.o. female patient complaining of right upper chest wall pain with deep inspirations. Patient stated onset was last night. Patient denies any URI signs symptoms. Patient denies tobacco use. Patient denies fever or night sweats.Patient denies pain at this time except when she takes a deep breath. No palliative measures for her complaint.   Past Medical History:  Diagnosis Date  . Hypertension     There are no active problems to display for this patient.   History reviewed. No pertinent surgical history.  Prior to Admission medications   Medication Sig Start Date End Date Taking? Authorizing Provider  amoxicillin (AMOXIL) 500 MG capsule Take 1 capsule (500 mg total) by mouth 3 (three) times daily. 08/06/15   Jenise V Bacon Menshew, PA-C  brompheniramine-pseudoephedrine-DM 30-2-10 MG/5ML syrup Take 5 mLs by mouth 4 (four) times daily as needed. 06/13/16   Joni Reiningonald K Ednah Hammock, PA-C  cyclobenzaprine (FLEXERIL) 5 MG tablet Take 1 tablet (5 mg total) by mouth every 8 (eight) hours as needed for muscle spasms. 08/06/15   Jenise V Bacon Menshew, PA-C  Diphenhyd-Hydrocort-Nystatin (FIRST-DUKES MOUTHWASH) SUSP Use as directed 10 mLs in the mouth or throat 4 (four) times daily. 06/13/16   Joni Reiningonald K Jestin Burbach, PA-C  gentamicin (GARAMYCIN) 0.3 % ophthalmic solution Place 1 drop into the right eye every 4 (four) hours. 02/17/16   Joni Reiningonald K Syana Degraffenreid, PA-C  lidocaine (XYLOCAINE) 2 % solution Use as directed 5 mLs in the mouth or throat every 6 (six) hours as needed for mouth pain. mix with mouthwash for swish and swallow. 06/13/16   Joni Reiningonald K Markie Heffernan, PA-C  meclizine (ANTIVERT) 25 MG tablet Take 1 tablet (25 mg total) by mouth 3 (three)  times daily as needed for dizziness. 07/30/15   Jene Everyobert Kinner, MD  nabumetone (RELAFEN) 750 MG tablet Take 1 tablet (750 mg total) by mouth 2 (two) times daily. 05/04/16   Jenise V Bacon Menshew, PA-C  naproxen (NAPROSYN) 500 MG tablet Take 1 tablet (500 mg total) by mouth 2 (two) times daily with a meal. 06/22/16   Joni Reiningonald K Cova Knieriem, PA-C  olopatadine (PATANOL) 0.1 % ophthalmic solution Place 1 drop into the right eye 2 (two) times daily. 12/29/14   Joni Reiningonald K Azzam Mehra, PA-C  predniSONE (STERAPRED UNI-PAK 21 TAB) 10 MG (21) TBPK tablet Take 6 tablets on day 1 Take 5 tablets on day 2 Take 4 tablets on day 3 Take 3 tablets on day 4 Take 2 tablets on day 5 Take 1 tablet on day 6 04/01/15   Chinita Pesterari B Triplett, FNP  traMADol (ULTRAM) 50 MG tablet Take 1 tablet (50 mg total) by mouth every 6 (six) hours as needed for moderate pain. 02/17/16   Joni Reiningonald K Delano Scardino, PA-C    Allergies Patient has no known allergies.  No family history on file.  Social History Social History  Substance Use Topics  . Smoking status: Never Smoker  . Smokeless tobacco: Never Used  . Alcohol use No    Review of Systems Constitutional: No fever/chills Eyes: No visual changes. ENT: No sore throat. Cardiovascular: Denies chest pain. Respiratory: Denies shortness of breath. Gastrointestinal: No abdominal pain.  No nausea, no vomiting.  No diarrhea.  No constipation. Genitourinary: Negative for dysuria. Musculoskeletal:  Negative for back pain. Skin: Negative for rash. Neurological: Negative for headaches, focal weakness or numbness.    ____________________________________________   PHYSICAL EXAM:  VITAL SIGNS: ED Triage Vitals  Enc Vitals Group     BP 06/22/16 1149 (!) 130/97     Pulse Rate 06/22/16 1149 (!) 102     Resp 06/22/16 1149 18     Temp 06/22/16 1149 98.7 F (37.1 C)     Temp Source 06/22/16 1149 Oral     SpO2 06/22/16 1149 96 %     Weight 06/22/16 1150 276 lb (125.2 kg)     Height 06/22/16 1150 5\' 4"   (1.626 m)     Head Circumference --      Peak Flow --      Pain Score 06/22/16 1150 0     Pain Loc --      Pain Edu? --      Excl. in GC? --     Constitutional: Alert and oriented. Well appearing and in no acute distress. Eyes: Conjunctivae are normal. PERRL. EOMI. Head: Atraumatic. Nose: No congestion/rhinnorhea. Mouth/Throat: Mucous membranes are moist.  Oropharynx non-erythematous. Neck: No stridor.  No cervical spine tenderness to palpation. Hematological/Lymphatic/Immunilogical: No cervical lymphadenopathy. Cardiovascular: Normal rate, regular rhythm. Grossly normal heart sounds.  Good peripheral circulation. Respiratory: Normal respiratory effort.  No retractions. Lungs CTAB. Gastrointestinal: Soft and nontender. No distention. No abdominal bruits. No CVA tenderness. Musculoskeletal: No obvious chest wall deformity. Equal expansion. Patient has some moderate guarding palpation the anterior right chest wall. Neurologic:  Normal speech and language. No gross focal neurologic deficits are appreciated. No gait instability. Skin:  Skin is warm, dry and intact. No rash noted. Psychiatric: Mood and affect are normal. Speech and behavior are normal.  ____________________________________________   LABS (all labs ordered are listed, but only abnormal results are displayed)  Labs Reviewed - No data to display ____________________________________________  EKG   ____________________________________________  RADIOLOGY   __No acute findings x-ray of the chest. __________________________________________   PROCEDURES  Procedure(s) performed: None  Procedures  Critical Care performed: No  ____________________________________________   INITIAL IMPRESSION / ASSESSMENT AND PLAN / ED COURSE  Pertinent labs & imaging results that were available during my care of the patient were reviewed by me and considered in my medical decision making (see chart for details).  Gastritis.  Patient given discharge Instructions. Patient given prescription for naproxen. Patient advised follow-up family doctor condition persists.      ____________________________________________   FINAL CLINICAL IMPRESSION(S) / ED DIAGNOSES  Final diagnoses:  Costochondritis      NEW MEDICATIONS STARTED DURING THIS VISIT:  New Prescriptions   NAPROXEN (NAPROSYN) 500 MG TABLET    Take 1 tablet (500 mg total) by mouth 2 (two) times daily with a meal.     Note:  This document was prepared using Dragon voice recognition software and may include unintentional dictation errors.    Joni Reining, PA-C 06/22/16 1248    Emily Filbert, MD 06/22/16 [phone]

## 2016-07-12 ENCOUNTER — Emergency Department
Admission: EM | Admit: 2016-07-12 | Discharge: 2016-07-12 | Disposition: A | Discharge status: Home / Self Care | Payer: Medicaid Other | Attending: Emergency Medicine | Admitting: Emergency Medicine

## 2016-07-12 ENCOUNTER — Encounter: Payer: Self-pay | Admitting: Emergency Medicine

## 2016-07-12 DIAGNOSIS — I10 Essential (primary) hypertension: Secondary | ICD-10-CM | POA: Diagnosis not present

## 2016-07-12 DIAGNOSIS — X58XXXA Exposure to other specified factors, initial encounter: Secondary | ICD-10-CM | POA: Diagnosis not present

## 2016-07-12 DIAGNOSIS — S0502XA Injury of conjunctiva and corneal abrasion without foreign body, left eye, initial encounter: Secondary | ICD-10-CM | POA: Diagnosis not present

## 2016-07-12 DIAGNOSIS — Y939 Activity, unspecified: Secondary | ICD-10-CM | POA: Insufficient documentation

## 2016-07-12 DIAGNOSIS — Y999 Unspecified external cause status: Secondary | ICD-10-CM | POA: Diagnosis not present

## 2016-07-12 DIAGNOSIS — Y929 Unspecified place or not applicable: Secondary | ICD-10-CM | POA: Insufficient documentation

## 2016-07-12 DIAGNOSIS — H5712 Ocular pain, left eye: Secondary | ICD-10-CM | POA: Diagnosis present

## 2016-07-12 MED ORDER — EYE WASH OPHTH SOLN
OPHTHALMIC | Status: AC
  Filled 2016-07-12: qty 118

## 2016-07-12 MED ORDER — TOBRAMYCIN 0.3 % OP SOLN: 2.0000 [drp] | OPHTHALMIC | 0 refills | Status: DC

## 2016-07-12 MED ORDER — TETRACAINE HCL 0.5 % OP SOLN
1.0000 [drp] | Freq: Once | OPHTHALMIC | Status: AC
  Administered 2016-07-12: 1 [drp] via OPHTHALMIC

## 2016-07-12 MED ORDER — FLUORESCEIN SODIUM 1 MG OP STRP
ORAL_STRIP | OPHTHALMIC | Status: AC
  Filled 2016-07-12: qty 1

## 2016-07-12 MED ORDER — FLUORESCEIN SODIUM 0.6 MG OP STRP
1.0000 | ORAL_STRIP | Freq: Once | OPHTHALMIC | Status: AC
  Administered 2016-07-12: 1 via OPHTHALMIC

## 2016-07-12 MED ORDER — EYE WASH OPHTH SOLN: 1.0000 [drp] | OPHTHALMIC | Status: DC | PRN

## 2016-07-12 MED ORDER — TETRACAINE HCL 0.5 % OP SOLN
OPHTHALMIC | Status: AC
  Filled 2016-07-12: qty 2

## 2016-07-12 NOTE — ED Provider Notes (Signed)
[facility] Emergency Department Provider Note   ____________________________________________   First MD Initiated Contact with Patient 07/12/16 1017     (approximate)  I have reviewed the triage vital signs and the nursing notes.   HISTORY  Chief Complaint Eye Pain    HPI Amanda Hunter is a 31 y.o. female states that she woke up with some left eye pain and drainage. She is unaware of any injury. She states that it felt as if there was something in her eye. She denies any photophobia and no blurred vision. She states that the drainage has been clear. She is unaware of any exposure to pinkeye.Patient rates her pain as a 10 over 10.   Past Medical History:  Diagnosis Date  . Hypertension     There are no active problems to display for this patient.   History reviewed. No pertinent surgical history.  Prior to Admission medications   Medication Sig Start Date End Date Taking? Authorizing Provider  tobramycin (TOBREX) 0.3 % ophthalmic solution Place 2 drops into the left eye every 4 (four) hours. 07/12/16   Tommi Rumps, PA-C    Allergies Patient has no known allergies.  No family history on file.  Social History Social History  Substance Use Topics  . Smoking status: Never Smoker  . Smokeless tobacco: Never Used  . Alcohol use No    Review of Systems Constitutional: No fever/chills Eyes: Left eye pain. Cardiovascular: Denies chest pain. Respiratory: Denies shortness of breath. Gastrointestinal:   No nausea, no vomiting.   Neurological: Negative for headaches  10-point ROS otherwise negative.  ____________________________________________   PHYSICAL EXAM:  VITAL SIGNS: ED Triage Vitals  Enc Vitals Group     BP 07/12/16 1017 (!) 141/90     Pulse Rate 07/12/16 1016 (!) 102     Resp 07/12/16 1016 18     Temp 07/12/16 1016 98.5 F (36.9 C)     Temp Source 07/12/16 1016 Oral     SpO2 07/12/16 1016 97 %     Weight 07/12/16  1016 276 lb (125.2 kg)     Height 07/12/16 1016  (1.626 m)     Head Circumference --      Peak Flow --      Pain Score 07/12/16 1015 10     Pain Loc --      Pain Edu? --      Excl. in GC? --     Constitutional: Alert and oriented. Well appearing and in no acute distress. Eyes: Conjunctivae Injected on the left. Clear on the right. PERRL. EOMI. Head: Atraumatic. Nose: No congestion/rhinnorhea. Neck: No stridor.   Cardiovascular: Normal rate, regular rhythm. Grossly normal heart sounds.  Good peripheral circulation. Respiratory: Normal respiratory effort.  No retractions. Lungs CTAB. Musculoskeletal: Moves upper and lower extremities find difficulty. Normal gait was noted. Neurologic:  Normal speech and language. No gross focal neurologic deficits are appreciated. No gait instability. Skin:  Skin is warm, dry and intact. No rash noted. Psychiatric: Mood and affect are normal. Speech and behavior are normal.  ____________________________________________   LABS (all labs ordered are listed, but only abnormal results are displayed)  Labs Reviewed - No data to display  PROCEDURES  Procedure(s) performed: 2 drops tetracaine was placed in the left eye. After sufficient anesthesia forcing was dropped. I was examined and a superficial minute abrasion is noted at approximately 5:30 o'clock area that is horizontal. Lid was inverted without foreign body noted.  Procedures  Critical Care performed: No  ____________________________________________   INITIAL IMPRESSION / ASSESSMENT AND PLAN / ED COURSE  Pertinent labs & imaging results that were available during my care of the patient were reviewed by me and considered in my medical decision making (see chart for details).  Patient is placed on Tobrex ophthalmic solution 2 drops to left eye every 4 hours while awake. She is given instructions to follow-up with [facility] if any continued problems in 24 hours.       ____________________________________________   FINAL CLINICAL IMPRESSION(S) / ED DIAGNOSES  Final diagnoses:  Abrasion of left cornea, initial encounter      NEW MEDICATIONS STARTED DURING THIS VISIT:  Discharge Medication List as of 07/12/2016 11:02 AM    START taking these medications   Details  tobramycin (TOBREX) 0.3 % ophthalmic solution Place 2 drops into the left eye every 4 (four) hours., Starting Wed 07/12/2016, Print         Note:  This document was prepared using Dragon voice recognition software and may include unintentional dictation errors.    Tommi Rumps, PA-C 07/12/16 1117    Charlynne Pander, MD 07/12/16 [phone]

## 2016-07-12 NOTE — ED Triage Notes (Signed)
Presents with pain to left eye with some drainage

## 2016-07-12 NOTE — Discharge Instructions (Signed)
Follow-up with [facility] if no improvement in 24 hours. Use Tobrex ophthalmic solution 2 drops every 4 hours while awake. Tylenol if needed for pain. Wear sunglasses if out in bright light.

## 2016-07-14 ENCOUNTER — Other Ambulatory Visit: Payer: Self-pay | Admitting: Ophthalmology

## 2016-07-14 DIAGNOSIS — H4710 Unspecified papilledema: Secondary | ICD-10-CM

## 2016-07-24 ENCOUNTER — Ambulatory Visit
Admission: RE | Admit: 2016-07-24 | Discharge: 2016-07-24 | Disposition: A | Discharge status: Home / Self Care | Payer: Medicaid Other | Source: Ambulatory Visit | Attending: Ophthalmology | Admitting: Ophthalmology

## 2016-07-24 ENCOUNTER — Encounter: Payer: Self-pay | Admitting: Emergency Medicine

## 2016-07-24 ENCOUNTER — Emergency Department: Payer: Medicaid Other

## 2016-07-24 ENCOUNTER — Emergency Department
Admission: EM | Admit: 2016-07-24 | Discharge: 2016-07-24 | Disposition: A | Discharge status: Home / Self Care | Payer: Medicaid Other | Attending: Emergency Medicine | Admitting: Emergency Medicine

## 2016-07-24 DIAGNOSIS — H4710 Unspecified papilledema: Secondary | ICD-10-CM

## 2016-07-24 DIAGNOSIS — M79661 Pain in right lower leg: Secondary | ICD-10-CM | POA: Diagnosis present

## 2016-07-24 DIAGNOSIS — L03115 Cellulitis of right lower limb: Secondary | ICD-10-CM | POA: Insufficient documentation

## 2016-07-24 DIAGNOSIS — I10 Essential (primary) hypertension: Secondary | ICD-10-CM | POA: Insufficient documentation

## 2016-07-24 MED ORDER — CEPHALEXIN 500 MG PO CAPS: 500.0000 mg | ORAL_CAPSULE | Freq: Four times a day (QID) | ORAL | 0 refills | Status: AC

## 2016-07-24 MED ORDER — CEPHALEXIN 500 MG PO CAPS
500.0000 mg | ORAL_CAPSULE | Freq: Once | ORAL | Status: AC
  Administered 2016-07-24: 500 mg via ORAL
  Filled 2016-07-24: qty 1

## 2016-07-24 NOTE — ED Notes (Signed)
Pt verbalized understanding of discharge instructions. NAD at this time. 

## 2016-07-24 NOTE — ED Provider Notes (Signed)
[facility] Emergency Department Provider Note  ____________________________________________  Time seen: Approximately 11:48 AM  I have reviewed the triage vital signs and the nursing notes.   HISTORY  Chief Complaint Insect Bite and Cellulitis    HPI Amanda Hunter is a 31 y.o. female that presents to the emergency department with lower right leg painfor 2 days. Leg is painful to touch. She is concerned for an insect bite. Patient does not remember being bit by an insect. She does not smoke. No recent surgeries. She denies fever, congestion, cough, shortness of breath, chest pain, nausea, vomiting, abdominal pain, numbness, tingling.   Past Medical History:  Diagnosis Date  . Hypertension     There are no active problems to display for this patient.   No past surgical history on file.  Prior to Admission medications   Medication Sig Start Date End Date Taking? Authorizing Provider  cephALEXin (KEFLEX) 500 MG capsule Take 1 capsule (500 mg total) by mouth 4 (four) times daily. 07/24/16 08/03/16  Enid Derry, PA-C  tobramycin (TOBREX) 0.3 % ophthalmic solution Place 2 drops into the left eye every 4 (four) hours. 07/12/16   Tommi Rumps, PA-C    Allergies Patient has no known allergies.  No family history on file.  Social History Social History  Substance Use Topics  . Smoking status: Never Smoker  . Smokeless tobacco: Never Used  . Alcohol use No     Review of Systems  Constitutional: No fever/chills Cardiovascular: No chest pain. Respiratory: No cough. No SOB. Gastrointestinal: No abdominal pain.  No nausea, no vomiting.  Musculoskeletal: Positive for leg pain. Skin: Negative for abrasions, lacerations, ecchymosis. Positive for rash.  Neurological: Negative for headaches, numbness or tingling   ____________________________________________   PHYSICAL EXAM:  VITAL SIGNS: ED Triage Vitals [07/24/16 1011]  Enc Vitals Group      BP (!) 146/86     Pulse Rate (!) 102     Resp 16     Temp 98.7 F (37.1 C)     Temp Source Oral     SpO2 98 %     Weight 276 lb (125.2 kg)     Height  (1.626 m)     Head Circumference      Peak Flow      Pain Score 10     Pain Loc      Pain Edu?      Excl. in GC?      Constitutional: Alert and oriented. Well appearing and in no acute distress. Eyes: Conjunctivae are normal. PERRL. EOMI. Head: Atraumatic. ENT:      Ears:      Nose: No congestion/rhinnorhea.      Mouth/Throat: Mucous membranes are moist.  Neck: No stridor.  Cardiovascular: Normal rate, regular rhythm.  Good peripheral circulation. Respiratory: Normal respiratory effort without tachypnea or retractions. Lungs CTAB. Good air entry to the bases with no decreased or absent breath sounds. Musculoskeletal: Full range of motion to all extremities. No gross deformities appreciated. Neurologic:  Normal speech and language. No gross focal neurologic deficits are appreciated.  Skin:  Skin is warm, dry and intact. Vesicle to lower right leg with 2 inches of erythema surrounding vesicle. Tenderness to palpation. No swelling.   ____________________________________________   LABS (all labs ordered are listed, but only abnormal results are displayed)  Labs Reviewed - No data to display ____________________________________________  EKG   ____________________________________________  RADIOLOGY  US Venous Img Lower Unilateral Right  Result  Date: 07/24/2016 CLINICAL DATA:  Pain swelling. EXAM: RIGHT LOWER EXTREMITY VENOUS DOPPLER ULTRASOUND TECHNIQUE: Gray-scale sonography with graded compression, as well as color Doppler and duplex ultrasound were performed to evaluate the lower extremity deep venous systems from the level of the common femoral vein and including the common femoral, femoral, profunda femoral, popliteal and calf veins including the posterior tibial, peroneal and gastrocnemius veins when visible. The  superficial great saphenous vein was also interrogated. Spectral Doppler was utilized to evaluate flow at rest and with distal augmentation maneuvers in the common femoral, femoral and popliteal veins. COMPARISON:  None. FINDINGS: Contralateral Common Femoral Vein: Respiratory phasicity is normal and symmetric with the symptomatic side. No evidence of thrombus. Normal compressibility. Common Femoral Vein: No evidence of thrombus. Normal compressibility, respiratory phasicity and response to augmentation. Saphenofemoral Junction: No evidence of thrombus. Normal compressibility and flow on color Doppler imaging. Profunda Femoral Vein: No evidence of thrombus. Normal compressibility and flow on color Doppler imaging. Femoral Vein: No evidence of thrombus. Normal compressibility, respiratory phasicity and response to augmentation. Popliteal Vein: No evidence of thrombus. Normal compressibility, respiratory phasicity and response to augmentation. Calf Veins: No evidence of thrombus. Normal compressibility and flow on color Doppler imaging. Superficial Great Saphenous Vein: No evidence of thrombus. Normal compressibility and flow on color Doppler imaging. Other Findings:  Limited exam due to patient's body habitus. IMPRESSION: No evidence of deep venous thrombosis. Electronically Signed   By: Maisie Fus  Register   On: 07/24/2016 12:06    ____________________________________________    PROCEDURES  Procedure(s) performed:    Procedures    Medications  cephALEXin (KEFLEX) capsule 500 mg (500 mg Oral Given 07/24/16 1251)     ____________________________________________   INITIAL IMPRESSION / ASSESSMENT AND PLAN / ED COURSE  Pertinent labs & imaging results that were available during my care of the patient were reviewed by me and considered in my medical decision making (see chart for details).  Review of the  [place] was performed in accordance of the [facility] prior to dispensing any controlled  drugs.    Patient's diagnosis is consistent with cellulitis. Vital signs and exam are reassuring. No evidence of DVT on ultrasound. Cellulitis was marked with a skin pen. Patient will be discharged home with prescriptions for Keflex. Patient is to follow up with PCP as directed. Patient is given ED precautions to return to the ED for any worsening or new symptoms.     ____________________________________________  FINAL CLINICAL IMPRESSION(S) / ED DIAGNOSES  Final diagnoses:  Cellulitis of right lower extremity      NEW MEDICATIONS STARTED DURING THIS VISIT:  Discharge Medication List as of 07/24/2016 12:41 PM    START taking these medications   Details  cephALEXin (KEFLEX) 500 MG capsule Take 1 capsule (500 mg total) by mouth 4 (four) times daily., Starting Mon 07/24/2016, Until Thu 08/03/2016, Print            This chart was dictated using voice recognition software/Dragon. Despite best efforts to proofread, errors can occur which can change the meaning. Any change was purely unintentional.    Enid Derry, PA-C 07/24/16 1616    Jene Every, MD 07/31/16 [phone]

## 2016-07-24 NOTE — ED Notes (Signed)
Circle drawn around wound by ED Medic

## 2016-07-24 NOTE — ED Triage Notes (Signed)
Pt has possible insect bite to right lower leg with redness and swelling surrounding bite x2 days.

## 2016-08-01 ENCOUNTER — Other Ambulatory Visit
Admission: RE | Admit: 2016-08-01 | Discharge: 2016-08-01 | Disposition: A | Discharge status: Home / Self Care | Payer: Medicaid Other | Source: Ambulatory Visit | Attending: Ophthalmology | Admitting: Ophthalmology

## 2016-08-01 ENCOUNTER — Ambulatory Visit
Admission: RE | Admit: 2016-08-01 | Discharge: 2016-08-01 | Disposition: A | Discharge status: Home / Self Care | Payer: Medicaid Other | Source: Ambulatory Visit | Attending: Ophthalmology | Admitting: Ophthalmology

## 2016-08-01 DIAGNOSIS — H4710 Unspecified papilledema: Secondary | ICD-10-CM | POA: Insufficient documentation

## 2016-08-01 DIAGNOSIS — R930 Abnormal findings on diagnostic imaging of skull and head, not elsewhere classified: Secondary | ICD-10-CM | POA: Insufficient documentation

## 2016-08-01 DIAGNOSIS — Z01818 Encounter for other preprocedural examination: Secondary | ICD-10-CM | POA: Diagnosis not present

## 2016-08-01 LAB — CREATININE, SERUM
Creatinine, Ser: 0.75 mg/dL (ref 0.44–1.00)
GFR calc Af Amer: >60 mL/min (ref >60)
GFR calc non Af Amer: >60 mL/min (ref >60)

## 2016-08-01 MED ORDER — GADOBENATE DIMEGLUMINE 529 MG/ML IV SOLN
20.0000 mL | Freq: Once | INTRAVENOUS | Status: AC | PRN
  Administered 2016-08-01: 20 mL via INTRAVENOUS

## 2016-08-02 ENCOUNTER — Ambulatory Visit: Payer: Medicaid Other

## 2016-08-04 ENCOUNTER — Other Ambulatory Visit: Payer: Self-pay | Admitting: Ophthalmology

## 2016-08-04 DIAGNOSIS — H4710 Unspecified papilledema: Secondary | ICD-10-CM

## 2016-08-10 ENCOUNTER — Other Ambulatory Visit: Payer: Medicaid Other

## 2016-12-26 ENCOUNTER — Encounter: Payer: Self-pay | Admitting: Emergency Medicine

## 2016-12-26 ENCOUNTER — Emergency Department
Admission: EM | Admit: 2016-12-26 | Discharge: 2016-12-26 | Disposition: A | Discharge status: Home / Self Care | Payer: Medicaid Other | Attending: Emergency Medicine | Admitting: Emergency Medicine

## 2016-12-26 DIAGNOSIS — R42 Dizziness and giddiness: Secondary | ICD-10-CM | POA: Diagnosis present

## 2016-12-26 DIAGNOSIS — E119 Type 2 diabetes mellitus without complications: Secondary | ICD-10-CM | POA: Diagnosis not present

## 2016-12-26 DIAGNOSIS — N390 Urinary tract infection, site not specified: Secondary | ICD-10-CM

## 2016-12-26 DIAGNOSIS — I10 Essential (primary) hypertension: Secondary | ICD-10-CM | POA: Insufficient documentation

## 2016-12-26 LAB — GLUCOSE, CAPILLARY: Glucose-Capillary: 98 mg/dL (ref 65–99)

## 2016-12-26 LAB — BASIC METABOLIC PANEL
ANION GAP: 10 (ref 5–15)
BUN: 10 mg/dL (ref 6–20)
CALCIUM: 9.2 mg/dL (ref 8.9–10.3)
CO2: 30 mmol/L (ref 22–32)
Chloride: 99 mmol/L — ABNORMAL LOW (ref 101–111)
Creatinine, Ser: 1.02 mg/dL — ABNORMAL HIGH (ref 0.44–1.00)
GLUCOSE: 103 mg/dL — AB (ref 65–99)
Potassium: 3.5 mmol/L (ref 3.5–5.1)
Sodium: 139 mmol/L (ref 135–145)

## 2016-12-26 LAB — CBC
HCT: 40 % (ref 35.0–47.0)
HEMOGLOBIN: 13.2 g/dL (ref 12.0–16.0)
MCH: 23.9 pg — ABNORMAL LOW (ref 26.0–34.0)
MCHC: 33 g/dL (ref 32.0–36.0)
MCV: 72.4 fL — ABNORMAL LOW (ref 80.0–100.0)
PLATELETS: 390 10*3/uL (ref 150–440)
RBC: 5.52 MIL/uL — ABNORMAL HIGH (ref 3.80–5.20)
RDW: 17.4 % — ABNORMAL HIGH (ref 11.5–14.5)
WBC: 15.6 10*3/uL — ABNORMAL HIGH (ref 3.6–11.0)

## 2016-12-26 LAB — URINALYSIS, COMPLETE (UACMP) WITH MICROSCOPIC
BILIRUBIN URINE: NEGATIVE
Bacteria, UA: NONE SEEN
GLUCOSE, UA: NEGATIVE mg/dL
HGB URINE DIPSTICK: NEGATIVE
Ketones, ur: NEGATIVE mg/dL
NITRITE: NEGATIVE
PH: 5 (ref 5.0–8.0)
Protein, ur: NEGATIVE mg/dL
SPECIFIC GRAVITY, URINE: 1.015 (ref 1.005–1.030)

## 2016-12-26 LAB — POCT PREGNANCY, URINE: PREG TEST UR: NEGATIVE

## 2016-12-26 MED ORDER — SODIUM CHLORIDE 0.9 % IV BOLUS (SEPSIS): 1000.0000 mL | Freq: Once | INTRAVENOUS | Status: DC

## 2016-12-26 MED ORDER — NITROFURANTOIN MONOHYD MACRO 100 MG PO CAPS
100.0000 mg | ORAL_CAPSULE | Freq: Once | ORAL | Status: AC
  Administered 2016-12-26: 100 mg via ORAL
  Filled 2016-12-26 (×2): qty 1

## 2016-12-26 MED ORDER — MECLIZINE HCL 25 MG PO TABS
25.0000 mg | ORAL_TABLET | Freq: Once | ORAL | Status: AC
  Administered 2016-12-26: 25 mg via ORAL

## 2016-12-26 MED ORDER — ONDANSETRON HCL 4 MG/2ML IJ SOLN: 4.0000 mg | Freq: Once | INTRAMUSCULAR | Status: DC

## 2016-12-26 MED ORDER — NITROFURANTOIN MONOHYD MACRO 100 MG PO CAPS: 100.0000 mg | ORAL_CAPSULE | Freq: Two times a day (BID) | ORAL | 0 refills | Status: AC

## 2016-12-26 MED ORDER — MECLIZINE HCL 25 MG PO TABS
ORAL_TABLET | ORAL | Status: AC
  Administered 2016-12-26: 25 mg via ORAL
  Filled 2016-12-26: qty 1

## 2016-12-26 NOTE — ED Triage Notes (Signed)
Pt c/o dizziness and generalized weakness x1 week. Pt denies syncopal episode, SOB and chest pain. Pt reports she was recently diagnosed with DM type 2 and started metformin on Sunday. Pt unaware of CBG readings, pt denies being educated on keeping check on blood sugar levels and diet.

## 2016-12-26 NOTE — ED Provider Notes (Signed)
[facility] Provider Note  ____________________________________________   First MD Initiated Contact with Patient 12/26/16 2038     (approximate)  I have reviewed the triage vital signs and the nursing notes.   HISTORY  Chief Complaint Dizziness   HPI Amanda Hunter is a 31 y.o. female with a history of diabetes and hypertension is presenting to the emergency department today with dizziness and weakness over the past week. She says that she also has associated nausea but has not any vomiting. Says that the dizziness worsens when she moves her head from side to side. Says that she does not feel like she is going to pass out. York Spaniel that she came in today because she said she was having difficulty concentrating because of her symptoms. She is denying any chest pain or abdominal pain. Denies any burning with urination or urinary frequency. She is taking medicine for her diabetes. Denies any ear pain or pressure. Denies any known sick contacts. Denies any fever.   Past Medical History:  Diagnosis Date  . Hypertension     There are no active problems to display for this patient.   History reviewed. No pertinent surgical history.  Prior to Admission medications   Medication Sig Start Date End Date Taking? Authorizing Provider  tobramycin (TOBREX) 0.3 % ophthalmic solution Place 2 drops into the left eye every 4 (four) hours. 07/12/16   Tommi Rumps, PA-C    Allergies Patient has no known allergies.  History reviewed. No pertinent family history.  Social History Social History  Substance Use Topics  . Smoking status: Never Smoker  . Smokeless tobacco: Never Used  . Alcohol use No    Review of Systems  Constitutional: No fever/chills Eyes: No visual changes. ENT: No sore throat. Cardiovascular: Denies chest pain. Respiratory: Denies shortness of breath. Gastrointestinal: No abdominal pain.   no vomiting.  No diarrhea.   No constipation. Genitourinary: Negative for dysuria. Musculoskeletal: Negative for back pain. Skin: Negative for rash. Neurological: Negative for headaches, focal weakness or numbness.   ____________________________________________   PHYSICAL EXAM:  VITAL SIGNS: ED Triage Vitals [12/26/16 2010]  Enc Vitals Group     BP (!) 160/100     Pulse Rate 100     Resp 17     Temp 98 F (36.7 C)     Temp Source Oral     SpO2 100 %     Weight 276 lb (125.2 kg)     Height      Head Circumference      Peak Flow      Pain Score      Pain Loc      Pain Edu?      Excl. in GC?     Constitutional: Alert and oriented. Well appearing and in no acute distress. Eyes: Conjunctivae are normal.  Head: Atraumatic.normal TMs bilaterally. Nose: No congestion/rhinnorhea. Mouth/Throat: Mucous membranes are moist.  Neck: No stridor.   Cardiovascular: Tachycardic, regular rhythm. Grossly normal heart sounds.  Respiratory: Normal respiratory effort.  No retractions. Lungs CTAB. Gastrointestinal: Soft and nontender. No distention. Musculoskeletal: No lower extremity tenderness nor edema.  No joint effusions. Neurologic:  Normal speech and language. No gross focal neurologic deficits are appreciated.no ataxia on finger to nose testing. No nystagmus. Skin:  Skin is warm, dry and intact. No rash noted. Psychiatric: Mood and affect are normal. Speech and behavior are normal.  ____________________________________________   LABS (all labs ordered are listed, but only abnormal  results are displayed)  Labs Reviewed  BASIC METABOLIC PANEL - Abnormal; Notable for the following:       Result Value   Chloride 99 (*)    Glucose, Bld 103 (*)    Creatinine, Ser 1.02 (*)    All other components within normal limits  CBC - Abnormal; Notable for the following:    WBC 15.6 (*)    RBC 5.52 (*)    MCV 72.4 (*)    MCH 23.9 (*)    RDW 17.4 (*)    All other components within normal limits  URINALYSIS,  COMPLETE (UACMP) WITH MICROSCOPIC - Abnormal; Notable for the following:    Color, Urine YELLOW (*)    APPearance HAZY (*)    Leukocytes, UA MODERATE (*)    Squamous Epithelial / LPF 6-30 (*)    All other components within normal limits  URINE CULTURE  GLUCOSE, CAPILLARY  CBG MONITORING, ED  POC URINE PREG, ED  POCT PREGNANCY, URINE   ____________________________________________  EKG  ED ECG REPORT I, Schaevitz,  Teena Irani, the attending physician, personally viewed and interpreted this ECG.   Date: 12/26/2016  EKG Time: 2013  Rate: 106  Rhythm: sinus tachycardia  Axis: normal  Intervals:none  ST&T Change: no ST segment elevation or depression. No abnormal T-wave inversion.  ____________________________________________  RADIOLOGY   ____________________________________________   PROCEDURES  Procedure(s) performed:   Procedures  Critical Care performed:   ____________________________________________   INITIAL IMPRESSION / ASSESSMENT AND PLAN / ED COURSE  Pertinent labs & imaging results that were available during my care of the patient were reviewed by me and considered in my medical decision making (see chart for details).  ----------------------------------------- 10:14 PM on 12/26/2016 -----------------------------------------  Patient says the meclizine did not help. However, with several glasses of water her heart rate is down to 100 bpm. Urinalysis with possible UTI as well as elevated white blood cell count. We will treat the patient with Macrobid. She will follow-up in the office with her primary care doctor. No distress at this time. No further complaints. He is understanding the plan and willing to comply.      ____________________________________________   FINAL CLINICAL IMPRESSION(S) / ED DIAGNOSES  UTI. Dizziness.    NEW MEDICATIONS STARTED DURING THIS VISIT:  New Prescriptions   No medications on file     Note:  This document was  prepared using Dragon voice recognition software and may include unintentional dictation errors.     Myrna Blazer, MD 12/26/16 2215

## 2016-12-28 LAB — URINE CULTURE

## 2017-08-06 ENCOUNTER — Emergency Department
Admission: EM | Admit: 2017-08-06 | Discharge: 2017-08-06 | Disposition: A | Discharge status: Home / Self Care | Payer: Medicaid Other | Attending: Emergency Medicine | Admitting: Emergency Medicine

## 2017-08-06 ENCOUNTER — Encounter: Payer: Self-pay | Admitting: Emergency Medicine

## 2017-08-06 DIAGNOSIS — I10 Essential (primary) hypertension: Secondary | ICD-10-CM | POA: Diagnosis not present

## 2017-08-06 DIAGNOSIS — K0889 Other specified disorders of teeth and supporting structures: Secondary | ICD-10-CM | POA: Diagnosis not present

## 2017-08-06 DIAGNOSIS — J029 Acute pharyngitis, unspecified: Secondary | ICD-10-CM | POA: Diagnosis present

## 2017-08-06 MED ORDER — IBUPROFEN 600 MG PO TABS: 600.0000 mg | ORAL_TABLET | Freq: Three times a day (TID) | ORAL | 0 refills | Status: DC | PRN

## 2017-08-06 MED ORDER — AMOXICILLIN 500 MG PO CAPS: 500.0000 mg | ORAL_CAPSULE | Freq: Three times a day (TID) | ORAL | 0 refills | Status: DC

## 2017-08-06 NOTE — ED Triage Notes (Signed)
Pt reports under her chin area hurts. Pt pointing to the area. Pt denies injuries, denies neck pain, throat pain just points to area under her chin. Pt sates that she thinks it started yesterday.

## 2017-08-06 NOTE — Discharge Instructions (Signed)
Call make an appointment with your dentist for evaluation of your dental pain.  Begin taking amoxicillin 500 mg 3 times daily for 10 days.  Ibuprofen 3 times daily with food as needed for pain.

## 2017-08-06 NOTE — ED Provider Notes (Signed)
[facility] Emergency Department Provider Note   ____________________________________________   First MD Initiated Contact with Patient 08/06/17 1004     (approximate)  I have reviewed the triage vital signs and the nursing notes.   HISTORY  Chief Complaint Sore Throat  HPI Amanda Hunter is a 32 y.o. female is here with complaint of pain to her gums on both sides of her lower jaw x1 day.  Patient will initially told the triage nurse that it was under her chin but when she was asked specifically to point she is pointing to bilateral incisors.  She denies any fever or chills.  There is been no nausea or vomiting.  She has no difficulty with swallowing or talking.  She rates her pain as an 8 out of 10.   Past Medical History:  Diagnosis Date  . Hypertension     There are no active problems to display for this patient.   History reviewed. No pertinent surgical history.  Prior to Admission medications   Medication Sig Start Date End Date Taking? Authorizing Provider  amoxicillin (AMOXIL) 500 MG capsule Take 1 capsule (500 mg total) by mouth 3 (three) times daily. 08/06/17   Tommi Rumps, PA-C  ibuprofen (ADVIL,MOTRIN) 600 MG tablet Take 1 tablet (600 mg total) by mouth every 8 (eight) hours as needed. 08/06/17   Tommi Rumps, PA-C    Allergies Patient has no known allergies.  No family history on file.  Social History Social History   Tobacco Use  . Smoking status: Never Smoker  . Smokeless tobacco: Never Used  Substance Use Topics  . Alcohol use: No  . Drug use: No    Review of Systems Constitutional: No fever/chills Eyes: No visual changes. ENT: Positive for dental pain/gum pain. Cardiovascular: Denies chest pain. Respiratory: Denies shortness of breath. Gastrointestinal:  No nausea, no vomiting.  Skin: Negative for rash. Neurological: Negative for headaches ____________________________________________   PHYSICAL  EXAM:  VITAL SIGNS: ED Triage Vitals  Enc Vitals Group     BP 08/06/17 0915 129/86     Pulse Rate 08/06/17 0915 97     Resp --      Temp 08/06/17 0915 98.1 F (36.7 C)     Temp Source 08/06/17 0915 Oral     SpO2 08/06/17 0915 97 %     Weight 08/06/17 0916 270 lb (122.5 kg)     Height 08/06/17 0916  (1.6 m)     Head Circumference --      Peak Flow --      Pain Score 08/06/17 0916 8     Pain Loc --      Pain Edu? --      Excl. in GC? --    Constitutional: Alert and oriented. Well appearing and in no acute distress. Eyes: Conjunctivae are normal.  Head: Atraumatic. Nose: No congestion/rhinnorhea. Mouth/Throat: Mucous membranes are moist.  Oropharynx non-erythematous.  Gums are slightly edematous right anterior and bilateral anterior mandible.  No evidence of drainage or focal abscess noted. Neck: No stridor. Hematological/Lymphatic/Immunilogical: No cervical lymphadenopathy.  Nontender submental area no fullness was noted. Cardiovascular: Normal rate, regular rhythm. Grossly normal heart sounds.  Good peripheral circulation. Respiratory: Normal respiratory effort.  No retractions. Lungs CTAB. Musculoskeletal: Moves upper and lower extremities without any difficulty.  Normal gait was noted. Neurologic:  Normal speech and language. No gross focal neurologic deficits are appreciated. Skin:  Skin is warm, dry and intact. No rash noted. Psychiatric: Mood  and affect are normal. Speech and behavior are normal.  ____________________________________________   LABS (all labs ordered are listed, but only abnormal results are displayed)  Labs Reviewed - No data to display  PROCEDURES  Procedure(s) performed: None  Procedures  Critical Care performed: No  ____________________________________________   INITIAL IMPRESSION / ASSESSMENT AND PLAN / ED COURSE Patient is to follow-up with a dentist for her dental and gum pain.  Patient was discharged with prescription for  amoxicillin 500 mg 3 times daily for 10 days and ibuprofen 600 mg 3 times daily with food.  She is encouraged to call make an appointment with the dentist for evaluation of her teeth. ____________________________________________   FINAL CLINICAL IMPRESSION(S) / ED DIAGNOSES  Final diagnoses:  Pain, dental     ED Discharge Orders        Ordered    amoxicillin (AMOXIL) 500 MG capsule  3 times daily     08/06/17 1129    ibuprofen (ADVIL,MOTRIN) 600 MG tablet  Every 8 hours PRN     08/06/17 1129       Note:  This document was prepared using Dragon voice recognition software and may include unintentional dictation errors.    Tommi Rumps, PA-C 08/06/17 1450    Minna Antis, MD 08/06/17 1524

## 2017-08-06 NOTE — ED Notes (Signed)
See triage note.  States she had subjective fever last week   Then noticed some pain to left jaw area  Now having pain to bilateral jaw areas  Afebrile at present

## 2017-08-06 NOTE — ED Triage Notes (Signed)
Pt reports it was jaw pain when it started but now its just under her chin.

## 2017-08-23 ENCOUNTER — Other Ambulatory Visit: Payer: Self-pay

## 2017-08-23 ENCOUNTER — Encounter: Payer: Self-pay | Admitting: Emergency Medicine

## 2017-08-23 ENCOUNTER — Emergency Department
Admission: EM | Admit: 2017-08-23 | Discharge: 2017-08-23 | Disposition: A | Discharge status: Home / Self Care | Payer: Medicaid Other | Attending: Emergency Medicine | Admitting: Emergency Medicine

## 2017-08-23 DIAGNOSIS — M542 Cervicalgia: Secondary | ICD-10-CM | POA: Insufficient documentation

## 2017-08-23 DIAGNOSIS — I10 Essential (primary) hypertension: Secondary | ICD-10-CM | POA: Diagnosis not present

## 2017-08-23 DIAGNOSIS — N898 Other specified noninflammatory disorders of vagina: Secondary | ICD-10-CM | POA: Diagnosis not present

## 2017-08-23 LAB — URINALYSIS, COMPLETE (UACMP) WITH MICROSCOPIC
Bacteria, UA: NONE SEEN
Bilirubin Urine: NEGATIVE
GLUCOSE, UA: NEGATIVE mg/dL
Hgb urine dipstick: NEGATIVE
KETONES UR: NEGATIVE mg/dL
Nitrite: NEGATIVE
PROTEIN: NEGATIVE mg/dL
Specific Gravity, Urine: 1.013 (ref 1.005–1.030)
pH: 5 (ref 5.0–8.0)

## 2017-08-23 NOTE — ED Provider Notes (Signed)
[facility] Emergency Department Provider Note  ____________________________________________   First MD Initiated Contact with Patient 08/23/17 1204     (approximate)  I have reviewed the triage vital signs and the nursing notes.   HISTORY  Chief Complaint Neck Pain and Urinary Tract Infection   HPI Amanda Hunter is a 32 y.o. female is here with complaint of vaginal itching that she believes to be a yeast infection.  She also is here because she is having neck discomfort and discontinued "popping her neck" because of what she has seen on TV.  She denies any injury to her neck.  She took ibuprofen once yesterday without any relief.  She denies any vaginal odor.  She rates her discomfort as a 7 out of 10.   Past Medical History:  Diagnosis Date  . Hypertension     There are no active problems to display for this patient.   History reviewed. No pertinent surgical history.  Prior to Admission medications   Not on File    Allergies Patient has no known allergies.  History reviewed. No pertinent family history.  Social History Social History   Tobacco Use  . Smoking status: Never Smoker  . Smokeless tobacco: Never Used  Substance Use Topics  . Alcohol use: No  . Drug use: No    Review of Systems Constitutional: No fever/chills Eyes: No visual changes. ENT: No sore throat. Cardiovascular: Denies chest pain. Respiratory: Denies shortness of breath. Genitourinary: Positive for vaginal itching.  Negative for dysuria. Musculoskeletal: Positive for neck pain. Skin: Negative for rash. Neurological: Negative for headaches, focal weakness or numbness. ____________________________________________   PHYSICAL EXAM:  VITAL SIGNS: ED Triage Vitals  Enc Vitals Group     BP 08/23/17 1151 (!) 146/81     Pulse Rate 08/23/17 1151 97     Resp --      Temp 08/23/17 1151 98.1 F (36.7 C)     Temp Source 08/23/17 1151 Oral     SpO2 08/23/17 1151  97 %     Weight 08/23/17 1152 270 lb (122.5 kg)     Height 08/23/17 1152  (1.6 m)     Head Circumference --      Peak Flow --      Pain Score 08/23/17 1151 7     Pain Loc --      Pain Edu? --      Excl. in GC? --    Constitutional: Alert and oriented. Well appearing and in no acute distress. Eyes: Conjunctivae are normal.  Head: Atraumatic. Nose: No congestion/rhinnorhea. Neck: No stridor.  Cervical tenderness on palpation posteriorly.  No difficulty with range of motion. Hematological/Lymphatic/Immunilogical: No cervical lymphadenopathy. Cardiovascular: Normal rate, regular rhythm. Grossly normal heart sounds.  Good peripheral circulation. Respiratory: Normal respiratory effort.  No retractions. Lungs CTAB. Genitourinary: Deferred exam per patient's request. Musculoskeletal: Able to move upper and lower extremities without any difficulty and normal gait was noted. Neurologic:  Normal speech and language. No gross focal neurologic deficits are appreciated.  Skin:  Skin is warm, dry and intact. No rash noted. Psychiatric: Mood and affect are normal. Speech and behavior are normal.  ____________________________________________   LABS (all labs ordered are listed, but only abnormal results are displayed)  Labs Reviewed  URINALYSIS, COMPLETE (UACMP) WITH MICROSCOPIC - Abnormal; Notable for the following components:      Result Value   Color, Urine YELLOW (*)    APPearance CLEAR (*)    Leukocytes, UA  SMALL (*)    All other components within normal limits  WET PREP, GENITAL     PROCEDURES  Procedure(s) performed: None  Procedures  Critical Care performed: No  ____________________________________________   INITIAL IMPRESSION / ASSESSMENT AND PLAN / ED COURSE  As part of my medical decision making, I reviewed the following data within the electronic MEDICAL RECORD NUMBER Notes from prior ED visits and [place] Controlled Substance Database  Patient is here with both  complaints of neck pain and vaginal itching.  Patient refused vaginal exam stating "I am not feeling it".  She prefers to buy something over-the-counter.  It was explained to her that if she does have a bacterial vaginosis that Monistat will not help with this.  She is willing to take that chance.  She denies any injury to her neck and states that she is going to continue "popping her neck".  Patient was discharged with instruction to follow-up with her PCP.  ____________________________________________   FINAL CLINICAL IMPRESSION(S) / ED DIAGNOSES  Final diagnoses:  Vaginal itching  Neck pain     ED Discharge Orders    None       Note:  This document was prepared using Dragon voice recognition software and may include unintentional dictation errors.    Tommi Rumps, PA-C 08/23/17 1315    Emily Filbert, MD 08/23/17 [phone]

## 2017-08-23 NOTE — ED Triage Notes (Signed)
Pt reports that she has been taking an antibiotic and has noticed some yeast

## 2017-08-23 NOTE — ED Triage Notes (Signed)
Pt reports that she has been popping her neck and since they had it on the news she has stopped now she states that she "needs" to pop her neck, but is afraid. Pt reports that it is uncomfortable and itches when she uses the rest room. She reports there is not an odor.

## 2017-08-23 NOTE — Discharge Instructions (Signed)
Follow-up with your doctor at [facility] health if any continued problems.  You may obtain over-the-counter Monistat if you believe that your symptoms are due to yeast.  If you have a bacterial vaginosis this will not help with your symptoms.  Ibuprofen 3 times a day and you may use moist heat or ice to your neck as needed.

## 2017-09-13 ENCOUNTER — Encounter: Payer: Self-pay | Admitting: Emergency Medicine

## 2017-09-13 ENCOUNTER — Emergency Department
Admission: EM | Admit: 2017-09-13 | Discharge: 2017-09-13 | Disposition: A | Discharge status: Home / Self Care | Payer: Medicaid Other | Attending: Emergency Medicine | Admitting: Emergency Medicine

## 2017-09-13 ENCOUNTER — Emergency Department: Payer: Medicaid Other

## 2017-09-13 ENCOUNTER — Other Ambulatory Visit: Payer: Self-pay

## 2017-09-13 DIAGNOSIS — R531 Weakness: Secondary | ICD-10-CM | POA: Insufficient documentation

## 2017-09-13 DIAGNOSIS — R42 Dizziness and giddiness: Secondary | ICD-10-CM

## 2017-09-13 DIAGNOSIS — I10 Essential (primary) hypertension: Secondary | ICD-10-CM | POA: Diagnosis not present

## 2017-09-13 LAB — URINALYSIS, ROUTINE W REFLEX MICROSCOPIC
BACTERIA UA: NONE SEEN
Bilirubin Urine: NEGATIVE
Glucose, UA: NEGATIVE mg/dL
Hgb urine dipstick: NEGATIVE
Ketones, ur: NEGATIVE mg/dL
NITRITE: NEGATIVE
PH: 5 (ref 5.0–8.0)
Protein, ur: NEGATIVE mg/dL
SPECIFIC GRAVITY, URINE: 1.011 (ref 1.005–1.030)

## 2017-09-13 LAB — BASIC METABOLIC PANEL
Anion gap: 10 (ref 5–15)
BUN: 10 mg/dL (ref 6–20)
CALCIUM: 9.1 mg/dL (ref 8.9–10.3)
CHLORIDE: 104 mmol/L (ref 101–111)
CO2: 27 mmol/L (ref 22–32)
CREATININE: 0.78 mg/dL (ref 0.44–1.00)
GFR calc non Af Amer: >60 mL/min (ref >60)
Glucose, Bld: 110 mg/dL — ABNORMAL HIGH (ref 65–99)
Potassium: 4 mmol/L (ref 3.5–5.1)
SODIUM: 141 mmol/L (ref 135–145)

## 2017-09-13 LAB — POCT PREGNANCY, URINE: Preg Test, Ur: NEGATIVE

## 2017-09-13 LAB — CBC
HCT: 40 % (ref 35.0–47.0)
Hemoglobin: 13.1 g/dL (ref 12.0–16.0)
MCH: 24.6 pg — AB (ref 26.0–34.0)
MCHC: 32.9 g/dL (ref 32.0–36.0)
MCV: 74.9 fL — AB (ref 80.0–100.0)
PLATELETS: 348 10*3/uL (ref 150–440)
RBC: 5.34 MIL/uL — AB (ref 3.80–5.20)
RDW: 15.1 % — AB (ref 11.5–14.5)
WBC: 8.9 10*3/uL (ref 3.6–11.0)

## 2017-09-13 LAB — TROPONIN I

## 2017-09-13 MED ORDER — FERROUS SULFATE 325 (65 FE) MG PO TBEC: 325.0000 mg | DELAYED_RELEASE_TABLET | Freq: Two times a day (BID) | ORAL | 0 refills | Status: DC

## 2017-09-13 NOTE — ED Triage Notes (Signed)
Pt reports for the last week has felt weak and dizzy.

## 2017-09-13 NOTE — ED Provider Notes (Signed)
[facility] Emergency Department Provider Note  Time seen: 12:45 PM  I have reviewed the triage vital signs and the nursing notes.   HISTORY  Chief Complaint Weakness and Dizziness    HPI Amanda Hunter is a 32 y.o. female with a past medical history of hypertension, presents to the emergency department for intermittent dizziness and weakness.  According to the patient for the past 2 to 3 weeks she has intermittently felt dizzy and weak.  States she has been eating and drinking, denies any recent illnesses fever, cough, congestion, vomiting, diarrhea or dysuria.  States it mostly occurs when she is up and walking around, sometimes she will have to sit down.  Denies any near syncopal or syncopal episodes.  Patient states she has similar symptoms years ago when she was diagnosed with iron deficiency, thought maybe this is what was going on so she took one iron supplement tablet yesterday but continued to feel bad today so she came to the emergency department.  Denies any chest pain or trouble breathing.   Past Medical History:  Diagnosis Date  . Hypertension     There are no active problems to display for this patient.   History reviewed. No pertinent surgical history.  Prior to Admission medications   Not on File    No Known Allergies  No family history on file.  Social History Social History   Tobacco Use  . Smoking status: Never Smoker  . Smokeless tobacco: Never Used  Substance Use Topics  . Alcohol use: No  . Drug use: No    Review of Systems Constitutional: Negative for fever.  Intermittent dizziness. Eyes: Negative for visual complaints ENT: Negative for recent illness/congestion Cardiovascular: Negative for chest pain. Respiratory: Negative for shortness of breath. Gastrointestinal: Negative for abdominal pain, vomiting Genitourinary: Negative for urinary compaints Musculoskeletal: Negative for musculoskeletal complaints Skin:  Negative for skin complaints  Neurological: Negative for headache All other ROS negative  ____________________________________________   PHYSICAL EXAM:  VITAL SIGNS: ED Triage Vitals  Enc Vitals Group     BP 09/13/17 1035 136/83     Pulse Rate 09/13/17 1035 88     Resp 09/13/17 1035 20     Temp 09/13/17 1035 98.1 F (36.7 C)     Temp Source 09/13/17 1035 Oral     SpO2 09/13/17 1035 100 %     Weight 09/13/17 1031 269 lb (122 kg)     Height 09/13/17 1031 5\' 4"  (1.626 m)     Head Circumference --      Peak Flow --      Pain Score 09/13/17 1031 0     Pain Loc --      Pain Edu? --      Excl. in GC? --    Constitutional: Alert and oriented. Well appearing and in no distress. Eyes: Normal exam ENT   Head: Normocephalic and atraumatic.   Mouth/Throat: Mucous membranes are moist. Cardiovascular: Normal rate, regular rhythm. No murmur Respiratory: Normal respiratory effort without tachypnea nor retractions. Breath sounds are clear Gastrointestinal: Soft and nontender. No distention.   Musculoskeletal: Nontender with normal range of motion in all extremities. No lower extremity tenderness or edema. Neurologic:  Normal speech and language. No gross focal neurologic deficits Skin:  Skin is warm, dry and intact.  Psychiatric: Mood and affect are normal. Speech and behavior are normal.   ____________________________________________    EKG  EKG reviewed and interpreted by myself shows normal sinus rhythm 87 bpm  with a narrow QRS, normal axis, normal intervals, no concerning ST changes.  ____________________________________________    RADIOLOGY  Chest x-ray negative  ____________________________________________   INITIAL IMPRESSION / ASSESSMENT AND PLAN / ED COURSE  Pertinent labs & imaging results that were available during my care of the patient were reviewed by me and considered in my medical decision making (see chart for details).  Patient presents to the  emergency department for intermittent dizziness over the past 2 to 3 weeks.  Differential would include anemia, electrolyte or metabolic abnormality, ACS, infectious etiology.  Overall the patient appears very well, no distress, largely normal physical examination. Chest x-ray negative. Labs are largely within normal limits, H&H is normal but the patient's MCV is low which could be due to iron deficiency.  Recommended the patient restart her iron supplements.  Urinalysis pending.  Urinalysis is equivocal.  Patient denies any urinary symptoms.  I will add on urine culture and hold off on treatment for now.  We will discharge with iron supplements and the patient will follow-up with her doctor. ____________________________________________   FINAL CLINICAL IMPRESSION(S) / ED DIAGNOSES  Dizziness    Minna AntisPaduchowski, Jhonny Calixto, MD 09/13/17 1323

## 2017-09-13 NOTE — ED Triage Notes (Signed)
Pt denies pain or SOB. Denies all other sx's.

## 2017-09-14 LAB — URINE CULTURE

## 2017-12-12 ENCOUNTER — Other Ambulatory Visit: Payer: Self-pay

## 2017-12-12 DIAGNOSIS — Y999 Unspecified external cause status: Secondary | ICD-10-CM | POA: Diagnosis not present

## 2017-12-12 DIAGNOSIS — S161XXA Strain of muscle, fascia and tendon at neck level, initial encounter: Secondary | ICD-10-CM | POA: Diagnosis not present

## 2017-12-12 DIAGNOSIS — R0789 Other chest pain: Secondary | ICD-10-CM | POA: Insufficient documentation

## 2017-12-12 DIAGNOSIS — Y939 Activity, unspecified: Secondary | ICD-10-CM | POA: Insufficient documentation

## 2017-12-12 DIAGNOSIS — X58XXXA Exposure to other specified factors, initial encounter: Secondary | ICD-10-CM | POA: Diagnosis not present

## 2017-12-12 DIAGNOSIS — K5900 Constipation, unspecified: Secondary | ICD-10-CM | POA: Diagnosis not present

## 2017-12-12 DIAGNOSIS — Y929 Unspecified place or not applicable: Secondary | ICD-10-CM | POA: Diagnosis not present

## 2017-12-12 DIAGNOSIS — R079 Chest pain, unspecified: Secondary | ICD-10-CM | POA: Diagnosis present

## 2017-12-12 DIAGNOSIS — I10 Essential (primary) hypertension: Secondary | ICD-10-CM | POA: Diagnosis not present

## 2017-12-12 NOTE — ED Triage Notes (Signed)
Pt arrives to ED via POV from home with c/o torticollis and CP x2 days. Pt reports centralized chest pressure without radiation. Pt denies N/V/D, no SHOB. Pt c/o neck pain that she says Might be a pulled muscle". Pt also states she "might be constipated". Pt is A&O, in NAD; RR even, regular, and unlabored.

## 2017-12-13 ENCOUNTER — Emergency Department
Admission: EM | Admit: 2017-12-13 | Discharge: 2017-12-13 | Disposition: A | Discharge status: Home / Self Care | Payer: Medicaid Other | Attending: Emergency Medicine | Admitting: Emergency Medicine

## 2017-12-13 ENCOUNTER — Emergency Department: Payer: Medicaid Other

## 2017-12-13 DIAGNOSIS — K5900 Constipation, unspecified: Secondary | ICD-10-CM

## 2017-12-13 DIAGNOSIS — R0789 Other chest pain: Secondary | ICD-10-CM

## 2017-12-13 DIAGNOSIS — S161XXA Strain of muscle, fascia and tendon at neck level, initial encounter: Secondary | ICD-10-CM

## 2017-12-13 LAB — BASIC METABOLIC PANEL
ANION GAP: 5 (ref 5–15)
BUN: 9 mg/dL (ref 6–20)
CHLORIDE: 104 mmol/L (ref 98–111)
CO2: 31 mmol/L (ref 22–32)
Calcium: 9 mg/dL (ref 8.9–10.3)
Creatinine, Ser: 0.81 mg/dL (ref 0.44–1.00)
GFR calc Af Amer: >60 mL/min (ref >60)
GLUCOSE: 107 mg/dL — AB (ref 70–99)
POTASSIUM: 3.6 mmol/L (ref 3.5–5.1)
Sodium: 140 mmol/L (ref 135–145)

## 2017-12-13 LAB — CBC
HEMATOCRIT: 35.3 % (ref 35.0–47.0)
HEMOGLOBIN: 11.9 g/dL — AB (ref 12.0–16.0)
MCH: 25 pg — ABNORMAL LOW (ref 26.0–34.0)
MCHC: 33.8 g/dL (ref 32.0–36.0)
MCV: 74 fL — AB (ref 80.0–100.0)
Platelets: 328 10*3/uL (ref 150–440)
RBC: 4.77 MIL/uL (ref 3.80–5.20)
RDW: 16.3 % — ABNORMAL HIGH (ref 11.5–14.5)
WBC: 12 10*3/uL — ABNORMAL HIGH (ref 3.6–11.0)

## 2017-12-13 LAB — TROPONIN I: Troponin I: <0.03 ng/mL (ref <0.03)

## 2017-12-13 LAB — POCT PREGNANCY, URINE: PREG TEST UR: NEGATIVE

## 2017-12-13 MED ORDER — GI COCKTAIL ~~LOC~~
ORAL | Status: AC
  Filled 2017-12-13: qty 30

## 2017-12-13 MED ORDER — LIDOCAINE 5 % EX PTCH
MEDICATED_PATCH | CUTANEOUS | Status: AC
  Filled 2017-12-13: qty 1

## 2017-12-13 MED ORDER — SUCRALFATE 1 G PO TABS
ORAL_TABLET | ORAL | Status: AC
  Filled 2017-12-13: qty 1

## 2017-12-13 NOTE — ED Provider Notes (Signed)
[facility] Emergency Department Provider Note  ____________________________________________   First MD Initiated Contact with Patient 12/13/17 [phone]     (approximate)  I have reviewed the triage vital signs and the nursing notes.   HISTORY  Chief Complaint Chest Pain and Torticollis   HPI Amanda Hunter is a 32 y.o. female who self presents to the emergency department with multiple issues.  She has centralized chest pressure that does not radiate.  She also says she has bilateral lower neck discomfort worse when lifting and improved with rest.  She says it feels "like a pulled muscle".  She is also concerned because she thinks she is constipated.  She did have a small bowel movement today.  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  No shortness of breath.  No abdominal pain nausea or vomiting.  History of DVT or pulmonary embolism.  Nothing seems to make the symptoms better or worse.     Past Medical History:  Diagnosis Date  . Hypertension     There are no active problems to display for this patient.   History reviewed. No pertinent surgical history.  Prior to Admission medications   Medication Sig Start Date End Date Taking? Authorizing Provider  ferrous sulfate 325 (65 FE) MG EC tablet Take 1 tablet (325 mg total) by mouth 2 (two) times daily. 09/13/17 10/13/17  Minna AntisPaduchowski, Kevin, MD    Allergies Patient has no known allergies.  No family history on file.  Social History Social History   Tobacco Use  . Smoking status: Never Smoker  . Smokeless tobacco: Never Used  Substance Use Topics  . Alcohol use: No  . Drug use: No    Review of Systems Constitutional: No fever/chills Eyes: No visual changes. ENT: No sore throat. Cardiovascular: Positive for chest pain. Respiratory: Denies shortness of breath. Gastrointestinal: No abdominal pain.  No  nausea, no vomiting.  No diarrhea.  Positive for constipation. Genitourinary: Negative for dysuria. Musculoskeletal: Positive for neck pain Skin: Negative for rash. Neurological: Negative for headaches, focal weakness or numbness.   ____________________________________________   PHYSICAL EXAM:  VITAL SIGNS: ED Triage Vitals [12/12/17 2353]  Enc Vitals Group     BP (!) 139/91     Pulse Rate (!) 101     Resp 17     Temp 98.6 F (37 C)     Temp Source Oral     SpO2 99 %     Weight 260 lb (117.9 kg)     Height 5\' 4"  (1.626 m)     Head Circumference      Peak Flow      Pain Score 10     Pain Loc      Pain Edu?      Excl. in GC?     Constitutional: Alert and oriented x4 pleasant cooperative speaks in full sentences Eyes: PERRL EOMI. Head: Atraumatic. Nose: No congestion/rhinnorhea. Mouth/Throat: No trismus Neck: No stridor.  Mild perispinal tenderness Cardiovascular: Normal rate, regular rhythm. Grossly normal heart sounds.  Good peripheral circulation. Respiratory: Normal respiratory effort.  No retractions. Lungs CTAB and moving good air Gastrointestinal: Obese soft nontender Musculoskeletal: No lower extremity edema   Neurologic:  Normal speech and language. No gross focal neurologic deficits are appreciated. Skin:  Skin is warm, dry and intact. No rash noted. Psychiatric: Mood and affect are normal. Speech and behavior are normal.  ____________________________________________   DIFFERENTIAL includes but not limited to  Muscle strain, radicular pain, acute coronary syndrome, pulmonary embolism, constipation ____________________________________________   LABS (all labs ordered are listed, but only abnormal results are displayed)  Labs Reviewed  BASIC METABOLIC PANEL - Abnormal; Notable for the following components:      Result Value   Glucose, Bld 107 (*)    All other components within normal limits  CBC - Abnormal; Notable for the following components:    WBC 12.0 (*)    Hemoglobin 11.9 (*)    MCV 74.0 (*)    MCH 25.0 (*)    RDW 16.3 (*)    All other components within normal limits  TROPONIN I  POC URINE PREG, ED  POCT PREGNANCY, URINE    Lab work reviewed by me with no acute disease noted __________________________________________  EKG  ED ECG REPORT I, Merrily Brittle, the attending physician, personally viewed and interpreted this ECG.  Date: 12/16/2017 EKG Time:  Rate: 98 Rhythm: normal sinus rhythm QRS Axis: normal Intervals: normal ST/T Wave abnormalities: normal Narrative Interpretation: no evidence of acute ischemia  ____________________________________________  RADIOLOGY  Chest x-ray reviewed by me with no acute disease ____________________________________________   PROCEDURES  Procedure(s) performed: no  Procedures  Critical Care performed: no  ____________________________________________   INITIAL IMPRESSION / ASSESSMENT AND PLAN / ED COURSE  Pertinent labs & imaging results that were available during my care of the patient were reviewed by me and considered in my medical decision making (see chart for details).   As part of my medical decision making, I reviewed the following data within the electronic MEDICAL RECORD NUMBER History obtained from family if available, nursing notes, old chart and ekg, as well as notes from prior ED visits.  The patient comes to the emergency department with a constellation of symptoms.  She has atypical chest pain with a normal EKG negative troponin.  She also has mild constipation and neck pain that appears to be from a pulled muscle.  Given reassurance and nonsteroidals for home.      ____________________________________________   FINAL CLINICAL IMPRESSION(S) / ED DIAGNOSES  Final diagnoses:  Atypical chest pain  Constipation, unspecified constipation type  Strain of neck muscle, initial encounter      NEW MEDICATIONS STARTED DURING THIS VISIT:  Discharge  Medication List as of 12/13/2017  6:35 AM       Note:  This document was prepared using Dragon voice recognition software and may include unintentional dictation errors.     Merrily Brittle, MD 12/16/17 [phone]

## 2017-12-13 NOTE — ED Notes (Signed)
Downtime, paper chart disposition  

## 2018-03-12 ENCOUNTER — Emergency Department: Payer: Medicaid Other

## 2018-03-12 ENCOUNTER — Other Ambulatory Visit: Payer: Self-pay

## 2018-03-12 ENCOUNTER — Encounter: Payer: Self-pay | Admitting: Emergency Medicine

## 2018-03-12 ENCOUNTER — Emergency Department
Admission: EM | Admit: 2018-03-12 | Discharge: 2018-03-12 | Disposition: A | Discharge status: Home / Self Care | Payer: Medicaid Other | Attending: Vascular Surgery | Admitting: Vascular Surgery

## 2018-03-12 DIAGNOSIS — R079 Chest pain, unspecified: Secondary | ICD-10-CM | POA: Diagnosis present

## 2018-03-12 DIAGNOSIS — Z5321 Procedure and treatment not carried out due to patient leaving prior to being seen by health care provider: Secondary | ICD-10-CM | POA: Insufficient documentation

## 2018-03-12 LAB — CBC WITH DIFFERENTIAL/PLATELET
ABS IMMATURE GRANULOCYTES: 0.04 10*3/uL (ref 0.00–0.07)
BASOS ABS: 0.1 10*3/uL (ref 0.0–0.1)
Basophils Relative: 1 %
EOS PCT: 1 %
Eosinophils Absolute: 0.1 10*3/uL (ref 0.0–0.5)
HEMATOCRIT: 39.3 % (ref 36.0–46.0)
Hemoglobin: 12.6 g/dL (ref 12.0–15.0)
Immature Granulocytes: 0 %
LYMPHS ABS: 3.3 10*3/uL (ref 0.7–4.0)
Lymphocytes Relative: 27 %
MCH: 24.2 pg — ABNORMAL LOW (ref 26.0–34.0)
MCHC: 32.1 g/dL (ref 30.0–36.0)
MCV: 75.6 fL — ABNORMAL LOW (ref 80.0–100.0)
MONO ABS: 1.2 10*3/uL — AB (ref 0.1–1.0)
Monocytes Relative: 10 %
NEUTROS ABS: 7.3 10*3/uL (ref 1.7–7.7)
NRBC: 0 % (ref 0.0–0.2)
Neutrophils Relative %: 61 %
Platelets: 392 10*3/uL (ref 150–400)
RBC: 5.2 MIL/uL — AB (ref 3.87–5.11)
RDW: 14.6 % (ref 11.5–15.5)
WBC: 12 10*3/uL — AB (ref 4.0–10.5)

## 2018-03-12 LAB — COMPREHENSIVE METABOLIC PANEL
ALBUMIN: 4.1 g/dL (ref 3.5–5.0)
ALK PHOS: 62 U/L (ref 38–126)
ALT: 14 U/L (ref 0–44)
AST: 16 U/L (ref 15–41)
Anion gap: 6 (ref 5–15)
BILIRUBIN TOTAL: 0.6 mg/dL (ref 0.3–1.2)
BUN: 11 mg/dL (ref 6–20)
CALCIUM: 9.1 mg/dL (ref 8.9–10.3)
CO2: 30 mmol/L (ref 22–32)
CREATININE: 0.72 mg/dL (ref 0.44–1.00)
Chloride: 105 mmol/L (ref 98–111)
GFR calc Af Amer: >60 mL/min (ref >60)
GFR calc non Af Amer: >60 mL/min (ref >60)
GLUCOSE: 103 mg/dL — AB (ref 70–99)
Potassium: 3.4 mmol/L — ABNORMAL LOW (ref 3.5–5.1)
SODIUM: 141 mmol/L (ref 135–145)
TOTAL PROTEIN: 7.7 g/dL (ref 6.5–8.1)

## 2018-03-12 LAB — TROPONIN I: Troponin I: <0.03 ng/mL (ref <0.03)

## 2018-03-12 NOTE — ED Notes (Signed)
Pt called at 2120 with no response.  Pt called at 2125 with no response.  Pt called at 2130 with no response.

## 2018-03-12 NOTE — ED Triage Notes (Signed)
Patient ambulatory to triage with steady gait, without difficulty or distress noted; pt reports since 1pm having right sided CP, nonradiating with no accomp symptoms; st pain increases with deep breathing

## 2018-03-13 ENCOUNTER — Telehealth: Payer: Self-pay | Admitting: Emergency Medicine

## 2018-03-13 NOTE — Telephone Encounter (Addendum)
Called patient due to lwot to inquire about condition and follow up plans. Left message.   Patient called me back and I advised her to tell her pcp her symptoms and have them review the results from ED visit.

## 2018-05-01 ENCOUNTER — Other Ambulatory Visit: Payer: Self-pay

## 2018-05-01 ENCOUNTER — Encounter: Payer: Self-pay | Admitting: Emergency Medicine

## 2018-05-01 ENCOUNTER — Emergency Department
Admission: EM | Admit: 2018-05-01 | Discharge: 2018-05-01 | Disposition: A | Discharge status: Home / Self Care | Payer: Medicaid Other | Attending: Emergency Medicine | Admitting: Emergency Medicine

## 2018-05-01 DIAGNOSIS — S199XXA Unspecified injury of neck, initial encounter: Secondary | ICD-10-CM | POA: Diagnosis present

## 2018-05-01 DIAGNOSIS — S161XXA Strain of muscle, fascia and tendon at neck level, initial encounter: Secondary | ICD-10-CM | POA: Insufficient documentation

## 2018-05-01 DIAGNOSIS — Y999 Unspecified external cause status: Secondary | ICD-10-CM | POA: Diagnosis not present

## 2018-05-01 DIAGNOSIS — X58XXXA Exposure to other specified factors, initial encounter: Secondary | ICD-10-CM | POA: Diagnosis not present

## 2018-05-01 DIAGNOSIS — Y929 Unspecified place or not applicable: Secondary | ICD-10-CM | POA: Diagnosis not present

## 2018-05-01 DIAGNOSIS — Y939 Activity, unspecified: Secondary | ICD-10-CM | POA: Insufficient documentation

## 2018-05-01 DIAGNOSIS — I10 Essential (primary) hypertension: Secondary | ICD-10-CM | POA: Insufficient documentation

## 2018-05-01 MED ORDER — MELOXICAM 15 MG PO TABS: 15.0000 mg | ORAL_TABLET | Freq: Every day | ORAL | 0 refills | Status: DC

## 2018-05-01 MED ORDER — METHOCARBAMOL 500 MG PO TABS: 500.0000 mg | ORAL_TABLET | Freq: Four times a day (QID) | ORAL | 0 refills | Status: DC

## 2018-05-01 NOTE — ED Provider Notes (Signed)
[facility] Emergency Department Provider Note  ____________________________________________  Time seen: Approximately 3:56 PM  I have reviewed the triage vital signs and the nursing notes.   HISTORY  Chief Complaint Neck Pain    HPI Amanda Hunter is a 33 y.o. female who presents emergency department complaining of bilateral neck pain, stiffness, headache.  Patient reports that symptoms have been increasing over the past week.  No trauma.  No radicular symptoms.  No URI symptoms.  No fevers or chills.  Patient had a similar episode approximately 4 months ago for which she was seen in the emergency department.  Patient was diagnosed with symptoms likely secondary to stress.  Patient reports that at this time the neck pain has returned and has an accompanying posterior headache.  She is not tried any medications over-the-counter.   No other complaints at this time.   Past Medical History:  Diagnosis Date  . Hypertension     There are no active problems to display for this patient.   History reviewed. No pertinent surgical history.  Prior to Admission medications   Medication Sig Start Date End Date Taking? Authorizing Provider  ferrous sulfate 325 (65 FE) MG EC tablet Take 1 tablet (325 mg total) by mouth 2 (two) times daily. 09/13/17 10/13/17  Minna AntisPaduchowski, Kevin, MD  meloxicam (MOBIC) 15 MG tablet Take 1 tablet (15 mg total) by mouth daily. 05/01/18   Ailis Rigaud, Delorise RoyalsJonathan D, PA-C  methocarbamol (ROBAXIN) 500 MG tablet Take 1 tablet (500 mg total) by mouth 4 (four) times daily. 05/01/18   Quin Mathenia, Delorise RoyalsJonathan D, PA-C    Allergies Patient has no known allergies.  No family history on file.  Social History Social History   Tobacco Use  . Smoking status: Never Smoker  . Smokeless tobacco: Never Used  Substance Use Topics  . Alcohol use: No  . Drug use: No     Review of Systems  Constitutional: No fever/chills Eyes: No visual changes.  ENT: No upper  respiratory complaints. Cardiovascular: no chest pain. Respiratory: no cough. No SOB. Gastrointestinal: No abdominal pain.  No nausea, no vomiting.  Musculoskeletal: Positive for bilateral neck pain Skin: Negative for rash, abrasions, lacerations, ecchymosis. Neurological: Endorses a headache but denies focal weakness or numbness. 10-point ROS otherwise negative.  ____________________________________________   PHYSICAL EXAM:  VITAL SIGNS: ED Triage Vitals  Enc Vitals Group     BP 05/01/18 1407 136/87     Pulse Rate 05/01/18 1407 (!) 101     Resp --      Temp 05/01/18 1407 98.7 F (37.1 C)     Temp Source 05/01/18 1407 Oral     SpO2 05/01/18 1407 98 %     Weight 05/01/18 1407 260 lb (117.9 kg)     Height 05/01/18 1407 5\' 4"  (1.626 m)     Head Circumference --      Peak Flow --      Pain Score 05/01/18 1411 8     Pain Loc --      Pain Edu? --      Excl. in GC? --      Constitutional: Alert and oriented. Well appearing and in no acute distress. Eyes: Conjunctivae are normal. PERRL. EOMI. Head: Atraumatic. ENT:      Ears:       Nose: No congestion/rhinnorhea.      Mouth/Throat: Mucous membranes are moist.  Neck: No stridor.  No midline cervical spine tenderness to palpation.  Mild diffuse tenderness to palpation of  bilateral paraspinal muscle groups no palpable abnormality.  Radial pulse intact bilateral upper extremities.  Sensation intact and equal in all dermatomal distributions bilateral upper extremities.  Negative Kernig's and Brudzinski's. Hematological/Lymphatic/Immunilogical: No cervical lymphadenopathy. Cardiovascular: Normal rate, regular rhythm. Normal S1 and S2.  Good peripheral circulation. Respiratory: Normal respiratory effort without tachypnea or retractions. Lungs CTAB. Good air entry to the bases with no decreased or absent breath sounds. Musculoskeletal: Full range of motion to all extremities. No gross deformities appreciated. Neurologic:  Normal speech  and language. No gross focal neurologic deficits are appreciated.  Cranial nerves II through XII grossly intact. Skin:  Skin is warm, dry and intact. No rash noted. Psychiatric: Mood and affect are normal. Speech and behavior are normal. Patient exhibits appropriate insight and judgement.   ____________________________________________   LABS (all labs ordered are listed, but only abnormal results are displayed)  Labs Reviewed - No data to display ____________________________________________  EKG   ____________________________________________  RADIOLOGY   No results found.  ____________________________________________    PROCEDURES  Procedure(s) performed:    Procedures    Medications - No data to display   ____________________________________________   INITIAL IMPRESSION / ASSESSMENT AND PLAN / ED COURSE  Pertinent labs & imaging results that were available during my care of the patient were reviewed by me and considered in my medical decision making (see chart for details).  Review of the [place] CSRS was performed in accordance of the [facility] prior to dispensing any controlled drugs.      Patient's diagnosis is consistent with cervical strain.  Patient presents emergency department with neck pain, mild headache.  Patient reports that symptoms have been ongoing times a week.  No recent injury or trauma.  No other concerning symptoms.  No indication for labs at this time.  As this is the second occurrence of nontraumatic neck pain, I did offer cervical x-ray for evaluation.  Patient verbalizes that she does not want to have an x-ray performed at this time.  She feels that this is "my muscles."  As such, patient will be treated with meloxicam and Robaxin on an outpatient basis.  Follow-up with primary care as needed.   Patient is given ED precautions to return to the ED for any worsening or new symptoms.     ____________________________________________  FINAL CLINICAL  IMPRESSION(S) / ED DIAGNOSES  Final diagnoses:  Strain of neck muscle, initial encounter      NEW MEDICATIONS STARTED DURING THIS VISIT:  ED Discharge Orders         Ordered    meloxicam (MOBIC) 15 MG tablet  Daily     05/01/18 1641    methocarbamol (ROBAXIN) 500 MG tablet  4 times daily     05/01/18 1641              This chart was dictated using voice recognition software/Dragon. Despite best efforts to proofread, errors can occur which can change the meaning. Any change was purely unintentional.    Racheal Patches, PA-C 05/01/18 1641    Dionne Bucy, MD 05/02/18 [phone]

## 2018-05-01 NOTE — ED Triage Notes (Signed)
PT arrives with complaints of left sided neck pain that was first present "a few months ago." Pt states she was seen for it prior and was told it was from stress. Pt has full ROM of neck. Pt denies known injury. Pt reports the pain often gives her a headache.

## 2018-07-15 ENCOUNTER — Emergency Department
Admission: EM | Admit: 2018-07-15 | Discharge: 2018-07-15 | Disposition: A | Discharge status: Home / Self Care | Payer: Medicaid Other | Attending: Emergency Medicine | Admitting: Emergency Medicine

## 2018-07-15 ENCOUNTER — Encounter: Payer: Self-pay | Admitting: Emergency Medicine

## 2018-07-15 ENCOUNTER — Other Ambulatory Visit: Payer: Self-pay

## 2018-07-15 DIAGNOSIS — H5712 Ocular pain, left eye: Secondary | ICD-10-CM | POA: Diagnosis present

## 2018-07-15 DIAGNOSIS — H1032 Unspecified acute conjunctivitis, left eye: Secondary | ICD-10-CM | POA: Insufficient documentation

## 2018-07-15 DIAGNOSIS — I10 Essential (primary) hypertension: Secondary | ICD-10-CM | POA: Diagnosis not present

## 2018-07-15 DIAGNOSIS — Z79899 Other long term (current) drug therapy: Secondary | ICD-10-CM | POA: Insufficient documentation

## 2018-07-15 MED ORDER — FLUORESCEIN SODIUM 1 MG OP STRP
1.0000 | ORAL_STRIP | Freq: Once | OPHTHALMIC | Status: AC
  Administered 2018-07-15: 16:00:00 1 via OPHTHALMIC
  Filled 2018-07-15: qty 1

## 2018-07-15 MED ORDER — OLOPATADINE HCL 0.2 % OP SOLN: 1.0000 [drp] | Freq: Every day | OPHTHALMIC | 0 refills | Status: DC | PRN

## 2018-07-15 MED ORDER — TETRACAINE HCL 0.5 % OP SOLN
2.0000 [drp] | Freq: Once | OPHTHALMIC | Status: AC
  Administered 2018-07-15: 2 [drp] via OPHTHALMIC
  Filled 2018-07-15: qty 4

## 2018-07-15 MED ORDER — TOBRAMYCIN 0.3 % OP OINT: TOPICAL_OINTMENT | Freq: Two times a day (BID) | OPHTHALMIC | 0 refills | Status: AC

## 2018-07-15 NOTE — ED Notes (Addendum)
Visual acuity preformed. Findings are as followed. R eye 20/25, L eye 20/25 and bilateral 20/25. Ashley,PA made aware.

## 2018-07-15 NOTE — ED Notes (Signed)
See triage note  Presents with left eye pain this am  States she did have some drainage to eye this am

## 2018-07-15 NOTE — ED Triage Notes (Signed)
Pt here with c/o left eye pain, swelling, redness that began around 0300 this am. Pt states "it feels like a scratch." NAD.

## 2018-07-15 NOTE — ED Provider Notes (Signed)
[facility] Emergency Department Provider Note  ____________________________________________  Time seen: Approximately 5:44 PM  I have reviewed the triage vital signs and the nursing notes.   HISTORY  Chief Complaint Eye Pain    HPI Amanda Hunter is a 33 y.o. female that presents emergency department for evaluation of right eye redness, irritation drainage for 1 day.  Patient states that it feels like something is in her eye and that eye feels "gritty." Eye has been draining this morning and dripping on close but she was unable to get a look at the drainage.  She does not wear glasses or contacts.  She states that this happens about every year and she is given drops for symptoms with relief.  She has seen ophthalmology in the past but cannot remember what they have told her.  No trauma to the eye.  No visual changes, floaters, flashers, photophobia, nausea, vomiting.   Past Medical History:  Diagnosis Date  . Hypertension     There are no active problems to display for this patient.   History reviewed. No pertinent surgical history.  Prior to Admission medications   Medication Sig Start Date End Date Taking? Authorizing Provider  ferrous sulfate 325 (65 FE) MG EC tablet Take 1 tablet (325 mg total) by mouth 2 (two) times daily. 09/13/17 10/13/17  Minna AntisPaduchowski, Kevin, MD  Olopatadine HCl 0.2 % SOLN Apply 1 drop to eye daily as needed. 07/15/18   Enid DerryWagner, Kyaira Trantham, PA-C  tobramycin (TOBREX) 0.3 % ophthalmic ointment Place into the left eye 2 (two) times daily for 10 days. Place a 1/2 inch ribbon of ointment into the lower eyelid. 07/15/18 07/25/18  Enid DerryWagner, Braxten Memmer, PA-C    Allergies Patient has no known allergies.  No family history on file.  Social History Social History   Tobacco Use  . Smoking status: Never Smoker  . Smokeless tobacco: Never Used  Substance Use Topics  . Alcohol use: No  . Drug use: No     Review of Systems  Constitutional: No  fever/chills Respiratory:  No SOB. Gastrointestinal:   No nausea, no vomiting.  Musculoskeletal: Negative for musculoskeletal pain. Skin: Negative for rash, abrasions, lacerations, ecchymosis. Neurological: Negative for headaches   ____________________________________________   PHYSICAL EXAM:  VITAL SIGNS: ED Triage Vitals  Enc Vitals Group     BP 07/15/18 1413 (!) 156/99     Pulse Rate 07/15/18 1412 (!) 101     Resp 07/15/18 1412 16     Temp 07/15/18 1412 98.6 F (37 C)     Temp Source 07/15/18 1412 Oral     SpO2 07/15/18 1412 98 %     Weight 07/15/18 1413 260 lb (117.9 kg)     Height 07/15/18 1413 5\' 4"  (1.626 m)     Head Circumference --      Peak Flow --      Pain Score 07/15/18 1412 10     Pain Loc --      Pain Edu? --      Excl. in GC? --      Constitutional: Alert and oriented. Well appearing and in no acute distress. Eyes: Left eye is injected. PERRL. EOMI. no swelling to eyelids.  No defect on fluorescein stain.  Tonometer pressures less than 20.  Visual acuity 20/25 right eye, 20/25 left eye, 20/25 bilaterally. Head: Atraumatic. ENT:      Ears:      Nose: No congestion/rhinnorhea.      Mouth/Throat: Mucous membranes are moist.  Neck: No stridor.   Cardiovascular: Normal rate, regular rhythm.  Good peripheral circulation. Respiratory: Normal respiratory effort without tachypnea or retractions. Lungs CTAB. Good air entry to the bases with no decreased or absent breath sounds. Musculoskeletal: Full range of motion to all extremities. No gross deformities appreciated. Neurologic:  Normal speech and language. No gross focal neurologic deficits are appreciated.  Skin:  Skin is warm, dry and intact. No rash noted. Psychiatric: Mood and affect are normal. Speech and behavior are normal. Patient exhibits appropriate insight and judgement.   ____________________________________________   LABS (all labs ordered are listed, but only abnormal results are  displayed)  Labs Reviewed - No data to display ____________________________________________  EKG   ____________________________________________  RADIOLOGY   No results found.  ____________________________________________    PROCEDURES  Procedure(s) performed:    Procedures    Medications  tetracaine (PONTOCAINE) 0.5 % ophthalmic solution 2 drop (2 drops Left Eye Given by Other 07/15/18 1556)  fluorescein ophthalmic strip 1 strip (1 strip Left Eye Given by Other 07/15/18 1556)     ____________________________________________   INITIAL IMPRESSION / ASSESSMENT AND PLAN / ED COURSE  Pertinent labs & imaging results that were available during my care of the patient were reviewed by me and considered in my medical decision making (see chart for details).  Review of the [place] CSRS was performed in accordance of the [facility] prior to dispensing any controlled drugs.     Patient's diagnosis is consistent with conjunctivitis.  Vital signs and exam are reassuring.  Patient states that previous drops have helped in the past and will be prescribed same.  Patient will be discharged home with prescriptions for Tobrex and olopatadine.  Drops were changed to ciprofloxacin because patient's insurance did not cover Tobrex.  Patient is to follow up with ophthalmology as directed.  Patient was given a referral to Dr. Druscilla Brownie, who she has seen in the past for same. Patient is given ED precautions to return to the ED for any worsening or new symptoms.     ____________________________________________  FINAL CLINICAL IMPRESSION(S) / ED DIAGNOSES  Final diagnoses:  Acute conjunctivitis of left eye, unspecified acute conjunctivitis type      NEW MEDICATIONS STARTED DURING THIS VISIT:  ED Discharge Orders         Ordered    tobramycin (TOBREX) 0.3 % ophthalmic ointment  2 times daily     07/15/18 1543    Olopatadine HCl 0.2 % SOLN  Daily PRN     07/15/18 1543               This chart was dictated using voice recognition software/Dragon. Despite best efforts to proofread, errors can occur which can change the meaning. Any change was purely unintentional.    Enid Derry, PA-C 07/15/18 2030    Don Perking, [place], MD 07/17/18 [phone]

## 2018-08-27 ENCOUNTER — Emergency Department: Payer: Medicaid Other

## 2018-08-27 ENCOUNTER — Emergency Department
Admission: EM | Admit: 2018-08-27 | Discharge: 2018-08-27 | Disposition: A | Discharge status: Home / Self Care | Payer: Medicaid Other | Attending: Emergency Medicine | Admitting: Emergency Medicine

## 2018-08-27 ENCOUNTER — Other Ambulatory Visit: Payer: Self-pay

## 2018-08-27 ENCOUNTER — Encounter: Payer: Self-pay | Admitting: Emergency Medicine

## 2018-08-27 DIAGNOSIS — H6982 Other specified disorders of Eustachian tube, left ear: Secondary | ICD-10-CM

## 2018-08-27 DIAGNOSIS — H9203 Otalgia, bilateral: Secondary | ICD-10-CM

## 2018-08-27 DIAGNOSIS — R42 Dizziness and giddiness: Secondary | ICD-10-CM

## 2018-08-27 DIAGNOSIS — H9209 Otalgia, unspecified ear: Secondary | ICD-10-CM | POA: Diagnosis present

## 2018-08-27 DIAGNOSIS — H6990 Unspecified Eustachian tube disorder, unspecified ear: Secondary | ICD-10-CM | POA: Insufficient documentation

## 2018-08-27 LAB — COMPREHENSIVE METABOLIC PANEL
ALT: 24 U/L (ref 0–44)
AST: 25 U/L (ref 15–41)
Albumin: 4.1 g/dL (ref 3.5–5.0)
Alkaline Phosphatase: 79 U/L (ref 38–126)
Anion gap: 9 (ref 5–15)
BUN: 7 mg/dL (ref 6–20)
CO2: 29 mmol/L (ref 22–32)
Calcium: 9.2 mg/dL (ref 8.9–10.3)
Chloride: 101 mmol/L (ref 98–111)
Creatinine, Ser: 0.69 mg/dL (ref 0.44–1.00)
GFR calc Af Amer: >60 mL/min (ref >60)
GFR calc non Af Amer: >60 mL/min (ref >60)
Glucose, Bld: 90 mg/dL (ref 70–99)
Potassium: 3.8 mmol/L (ref 3.5–5.1)
Sodium: 139 mmol/L (ref 135–145)
Total Bilirubin: 0.6 mg/dL (ref 0.3–1.2)
Total Protein: 8 g/dL (ref 6.5–8.1)

## 2018-08-27 LAB — URINALYSIS, COMPLETE (UACMP) WITH MICROSCOPIC
Bacteria, UA: NONE SEEN
Bilirubin Urine: NEGATIVE
Glucose, UA: NEGATIVE mg/dL
Hgb urine dipstick: NEGATIVE
Ketones, ur: NEGATIVE mg/dL
Nitrite: NEGATIVE
Protein, ur: NEGATIVE mg/dL
Specific Gravity, Urine: 1.011 (ref 1.005–1.030)
pH: 6 (ref 5.0–8.0)

## 2018-08-27 LAB — CBC WITH DIFFERENTIAL/PLATELET
Abs Immature Granulocytes: 0.05 10*3/uL (ref 0.00–0.07)
Basophils Absolute: 0.1 10*3/uL (ref 0.0–0.1)
Basophils Relative: 1 %
Eosinophils Absolute: 0.1 10*3/uL (ref 0.0–0.5)
Eosinophils Relative: 1 %
HCT: 42.8 % (ref 36.0–46.0)
Hemoglobin: 13.4 g/dL (ref 12.0–15.0)
Immature Granulocytes: 0 %
Lymphocytes Relative: 28 %
Lymphs Abs: 3.7 10*3/uL (ref 0.7–4.0)
MCH: 23.7 pg — ABNORMAL LOW (ref 26.0–34.0)
MCHC: 31.3 g/dL (ref 30.0–36.0)
MCV: 75.8 fL — ABNORMAL LOW (ref 80.0–100.0)
Monocytes Absolute: 1.2 10*3/uL — ABNORMAL HIGH (ref 0.1–1.0)
Monocytes Relative: 9 %
Neutro Abs: 8.1 10*3/uL — ABNORMAL HIGH (ref 1.7–7.7)
Neutrophils Relative %: 61 %
Platelets: 405 10*3/uL — ABNORMAL HIGH (ref 150–400)
RBC: 5.65 MIL/uL — ABNORMAL HIGH (ref 3.87–5.11)
RDW: 15.3 % (ref 11.5–15.5)
WBC: 13.2 10*3/uL — ABNORMAL HIGH (ref 4.0–10.5)
nRBC: 0 % (ref 0.0–0.2)

## 2018-08-27 LAB — GLUCOSE, CAPILLARY: Glucose-Capillary: 89 mg/dL (ref 70–99)

## 2018-08-27 LAB — TROPONIN I: Troponin I: <0.03 ng/mL (ref <0.03)

## 2018-08-27 MED ORDER — FLUTICASONE PROPIONATE 50 MCG/ACT NA SUSP: 1.0000 | Freq: Two times a day (BID) | NASAL | 0 refills | Status: DC

## 2018-08-27 MED ORDER — MECLIZINE HCL 12.5 MG PO TABS: 12.5000 mg | ORAL_TABLET | Freq: Three times a day (TID) | ORAL | 0 refills | Status: DC | PRN

## 2018-08-27 NOTE — ED Notes (Signed)
Urine sent to lab  Awaiting results  Denies any new complaints

## 2018-08-27 NOTE — ED Provider Notes (Signed)
-----------------------------------------   3:29 PM on 08/27/2018 -----------------------------------------   Blood pressure 138/90, pulse 88, temperature 98.6 F (37 C), temperature source Oral, resp. rate 20, height 5\' 4"  (1.626 m), weight 117.9 kg, last menstrual period 08/06/2018, SpO2 99 %.  Assuming care from Kem Boroughs, FNP.  In short, Amanda Hunter is a 33 y.o. female with a chief complaint of Otalgia and Dizziness .  Refer to the original H&P for additional details.  The current plan of care is to await imaging and labs.  Patient presented to the emergency department complaining of ear pain and dizziness.  Patient presented to the emergency department complaining of  ear pain and dizziness lasting for a week but worse today.  I took care from Kem Boroughs, [place].  At this time plan was to await results to include labs and imaging..  Overall, labs are reassuring.  Patient does have a slightly elevated white blood cell count.  On review of previous labs, patient appears to have chronically elevated white blood cell count even without other sources of infection.  I suspect that this is an incidental finding.  Patient did have some mild leukocytes in her urine but was asymptomatic for dysuria.  As such, I will not treat for UTI.  At this time, result or otherwise reassuring.  No indication for further work-up.    Patient does admit to symptoms of allergic rhinitis with some mild sneezing, congestion.  Overall, patient does have minimal TM bulging with no signs of infection to bilateral ears.  No tenderness to percussion over the sinuses.  I suspect allergic rhinitis causing mild eustachian tube dysfunction which has led to mild vertigo-like symptoms.  I will treat the patient with Flonase for allergic rhinitis, meclizine for vertigo.    ED ECG REPORT I, Delorise Royals [place],  personally viewed and interpreted this ECG.   Date: 08/27/2018  EKG Time: 1533 hrs.  Rate: 90 bpm  Rhythm: normal  EKG, normal sinus rhythm, unchanged from previous tracings  Axis: Normal axis  Intervals:none  ST&T Change: No ST elevation or depression noted.  Normal EKG.  No STEMI.     ED diagnosis:  Vertigo Eustachian tube dysfunction      Racheal Patches, PA-C 08/27/18 1736    Emily Filbert, MD 08/27/18 [phone]

## 2018-08-27 NOTE — ED Triage Notes (Signed)
Pt reports pain to both ears dizziness for a week.

## 2018-08-27 NOTE — ED Provider Notes (Signed)
[facility] Emergency Department Provider Note  ____________________________________________   First MD Initiated Contact with Patient 08/27/18 1237     (approximate)  I have reviewed the triage vital signs and the nursing notes.   HISTORY  Chief Complaint Otalgia and Dizziness  HPI Amanda Hunter is a 33 y.o. female who presents to the emergency department for treatment and evaluation of dizziness and bilateral earache. Symptoms started about a week ago and got worse today. She states that she was at work when she began to feel dizzy. She got up to walk to her car and felt like she was going to faint. She states that she occasionally has dizzy spells, but is usually able to lay down and the feeling passes. No known history of diabetes.     Past Medical History:  Diagnosis Date  . Hypertension     There are no active problems to display for this patient.   History reviewed. No pertinent surgical history.  Prior to Admission medications   Medication Sig Start Date End Date Taking? Authorizing Provider  ferrous sulfate 325 (65 FE) MG EC tablet Take 1 tablet (325 mg total) by mouth 2 (two) times daily. 09/13/17 10/13/17  Minna AntisPaduchowski, Kevin, MD  fluticasone (FLONASE) 50 MCG/ACT nasal spray Place 1 spray into both nostrils 2 (two) times daily. 08/27/18   Cuthriell, Delorise RoyalsJonathan D, PA-C  meclizine (ANTIVERT) 12.5 MG tablet Take 1 tablet (12.5 mg total) by mouth 3 (three) times daily as needed for dizziness. 08/27/18   Cuthriell, Delorise RoyalsJonathan D, PA-C  Olopatadine HCl 0.2 % SOLN Apply 1 drop to eye daily as needed. 07/15/18   Enid DerryWagner, Ashley, PA-C    Allergies Patient has no known allergies.  No family history on file.  Social History Social History   Tobacco Use  . Smoking status: Never Smoker  . Smokeless tobacco: Never Used  Substance Use Topics  . Alcohol use: No  . Drug use: No    Review of Systems  Constitutional: No fever/chills Eyes: No visual  changes. ENT: No sore throat. Cardiovascular: Denies chest pain. Respiratory: Denies shortness of breath. Gastrointestinal: No abdominal pain.  No nausea, no vomiting.  No diarrhea.  No constipation. Genitourinary: Negative for dysuria. Musculoskeletal: Negative for back pain. Skin: Negative for rash. Neurological: Negative for headaches, focal weakness or numbness. Positive for dizziness. ____________________________________________   PHYSICAL EXAM:  VITAL SIGNS: ED Triage Vitals  Enc Vitals Group     BP 08/27/18 1223 (!) 140/94     Pulse Rate 08/27/18 1223 97     Resp 08/27/18 1223 20     Temp 08/27/18 1223 98.6 F (37 C)     Temp Source 08/27/18 1223 Oral     SpO2 08/27/18 1223 98 %     Weight 08/27/18 1222 260 lb (117.9 kg)     Height 08/27/18 1222 5\' 4"  (1.626 m)     Head Circumference --      Peak Flow --      Pain Score 08/27/18 1222 10     Pain Loc --      Pain Edu? --      Excl. in GC? --     Constitutional: Alert and oriented. Well appearing and in no acute distress. Eyes: Conjunctivae are normal. PERRL. EOMI. Head: Atraumatic. Nose: No congestion/rhinnorhea. Mouth/Throat: Mucous membranes are moist.  Oropharynx non-erythematous. Neck: No stridor.   Cardiovascular: Normal rate, regular rhythm. Grossly normal heart sounds.  Good peripheral circulation. Respiratory: Normal respiratory effort.  No retractions. Lungs CTAB. Gastrointestinal: Soft and nontender. No distention. No abdominal bruits. No CVA tenderness. Musculoskeletal: No lower extremity tenderness nor edema.  No joint effusions. Neurologic:  Normal speech and language. No gross focal neurologic deficits are appreciated. No gait instability. Skin:  Skin is warm, dry and intact. No rash noted. Psychiatric: Mood and affect are normal. Speech and behavior are normal.  ____________________________________________   LABS (all labs ordered are listed, but only abnormal results are displayed)  Labs  Reviewed  CBC WITH DIFFERENTIAL/PLATELET - Abnormal; Notable for the following components:      Result Value   WBC 13.2 (*)    RBC 5.65 (*)    MCV 75.8 (*)    MCH 23.7 (*)    Platelets 405 (*)    Neutro Abs 8.1 (*)    Monocytes Absolute 1.2 (*)    All other components within normal limits  URINALYSIS, COMPLETE (UACMP) WITH MICROSCOPIC - Abnormal; Notable for the following components:   Color, Urine YELLOW (*)    APPearance HAZY (*)    Leukocytes,Ua MODERATE (*)    All other components within normal limits  GLUCOSE, CAPILLARY  COMPREHENSIVE METABOLIC PANEL  TROPONIN I  CBG MONITORING, ED  POC URINE PREG, ED   ____________________________________________  EKG  pending ____________________________________________  RADIOLOGY  ED MD interpretation:    Official radiology report(s): No results found.  ____________________________________________   PROCEDURES  Procedure(s) performed: None  Procedures  Critical Care performed: No  ____________________________________________   INITIAL IMPRESSION / ASSESSMENT AND PLAN / ED COURSE  33 year old female presents to the emergency department for treatment and evaluation of bilateral otalgia with dizziness.  Symptoms have been present for about a week but have acutely worsened today.  While at work, she was seated and began to feel like she was spinning.  She stood up to walk out to her car and felt as if she was going to pass out.  She was able to drive to the emergency department without incident. Will start her work up with CBG and go from there. Overall she appears well and was observed ambulating to room with a steady unassisted gait.   CBG normal. Patient states that she continues to have dizziness while lying on the stretcher. Additional work up required. Patient care will be relinquished to [facility], PA-C who will follow her to disposition.      ____________________________________________   FINAL  CLINICAL IMPRESSION(S) / ED DIAGNOSES  Final diagnoses:  Dizziness  Otalgia of both ears  Eustachian tube dysfunction, left  Vertigo     ED Discharge Orders         Ordered    fluticasone (FLONASE) 50 MCG/ACT nasal spray  2 times daily     08/27/18 1735    meclizine (ANTIVERT) 12.5 MG tablet  3 times daily PRN     08/27/18 1735           Note:  This document was prepared using Dragon voice recognition software and may include unintentional dictation errors.    Chinita Pester, FNP 08/31/18 0715    Sharman Cheek, MD 08/31/18 [phone]

## 2018-08-27 NOTE — ED Notes (Signed)
See triage note  Presents with some dizziness and bilateral ear pain  States she has had dizziness for some time

## 2018-11-10 ENCOUNTER — Encounter: Payer: Self-pay | Admitting: Emergency Medicine

## 2018-11-10 ENCOUNTER — Emergency Department
Admission: EM | Admit: 2018-11-10 | Discharge: 2018-11-10 | Disposition: A | Discharge status: Home / Self Care | Payer: Medicaid Other | Attending: Emergency Medicine | Admitting: Emergency Medicine

## 2018-11-10 ENCOUNTER — Other Ambulatory Visit: Payer: Self-pay

## 2018-11-10 DIAGNOSIS — M10071 Idiopathic gout, right ankle and foot: Secondary | ICD-10-CM | POA: Insufficient documentation

## 2018-11-10 DIAGNOSIS — Z91013 Allergy to seafood: Secondary | ICD-10-CM | POA: Insufficient documentation

## 2018-11-10 DIAGNOSIS — I10 Essential (primary) hypertension: Secondary | ICD-10-CM | POA: Insufficient documentation

## 2018-11-10 DIAGNOSIS — Z79899 Other long term (current) drug therapy: Secondary | ICD-10-CM | POA: Insufficient documentation

## 2018-11-10 DIAGNOSIS — M79671 Pain in right foot: Secondary | ICD-10-CM | POA: Diagnosis present

## 2018-11-10 LAB — URIC ACID: Uric Acid, Serum: 7.7 mg/dL — ABNORMAL HIGH (ref 2.5–7.1)

## 2018-11-10 MED ORDER — DICLOFENAC SODIUM 50 MG PO TBEC: 50.0000 mg | DELAYED_RELEASE_TABLET | Freq: Two times a day (BID) | ORAL | 0 refills | Status: AC | PRN

## 2018-11-10 MED ORDER — EPINEPHRINE 0.3 MG/0.3ML IJ SOAJ: 0.3000 mg | INTRAMUSCULAR | 1 refills | Status: AC | PRN

## 2018-11-10 MED ORDER — DICLOFENAC SODIUM 25 MG PO TBEC
50.0000 mg | DELAYED_RELEASE_TABLET | Freq: Once | ORAL | Status: AC
  Administered 2018-11-10: 50 mg via ORAL
  Filled 2018-11-10: qty 2

## 2018-11-10 MED ORDER — HYDROCODONE-ACETAMINOPHEN 5-325 MG PO TABS: 1.0000 | ORAL_TABLET | Freq: Three times a day (TID) | ORAL | 0 refills | Status: AC | PRN

## 2018-11-10 NOTE — ED Triage Notes (Signed)
Pt arrived via POV with reports of right foot pain that started when she woke up.

## 2018-11-10 NOTE — Discharge Instructions (Addendum)
Your uric acid level was elevated. This indicates that your foot pain may be due to gout. Take the pain medicine as directed. You also have a seafood allergy, as confirmed by your hives reaction every time you eat shellfish. Use the Epi-pen as needed for anaphylaxis, keep Benadryl around for any hives or swelling.

## 2018-11-10 NOTE — ED Provider Notes (Signed)
[facility] Emergency Department Provider Note ____________________________________________  Time seen: 1340  I have reviewed the triage vital signs and the nursing notes.  HISTORY  Chief Complaint  Foot Pain  HPI Amanda Hunter is a 33 y.o. female presents herself to the ED for evaluation of sudden acute right foot pain.  Patient describes onset this morning as she awoke.  She describes exquisite tenderness to touch to the right foot at the great toe and first MTP.  She denies any known trauma, accident, or injury.  She has had pain with attempts to weight-bear and walk.  She thought that her children may have injured her foot inadvertently overnight.  Otherwise she only reports that she ate crab legs at a local seafood restaurant yesterday, prior to onset.  She also admits during that same meal she experienced some hives to her trunk and torso.  Patient denies any fever, chills, sweats, chest pain, shortness of breath, or difficulty controlling secretions.  Incidentally, she recalls previous episodes of itchy hives that have occurred while eating crab legs and her seafood dinner.  She denies any history of foot pain, arthritis, foot surgery, or other joint pains.  Past Medical History:  Diagnosis Date  . Hypertension     There are no active problems to display for this patient.   History reviewed. No pertinent surgical history.  Prior to Admission medications   Medication Sig Start Date End Date Taking? Authorizing Provider  diclofenac (VOLTAREN) 50 MG EC tablet Take 1 tablet (50 mg total) by mouth 2 (two) times daily as needed for up to 15 days. 11/10/18 11/25/18  Karen Huhta, Charlesetta IvoryJenise V Bacon, PA-C  EPINEPHrine 0.3 mg/0.3 mL IJ SOAJ injection Inject 0.3 mLs (0.3 mg total) into the muscle as needed for up to 2 doses for anaphylaxis. Inject into the upper thigh 11/10/18   Nathyn Luiz, Charlesetta IvoryJenise V Bacon, PA-C  ferrous sulfate 325 (65 FE) MG EC tablet Take 1 tablet (325 mg total) by  mouth 2 (two) times daily. 09/13/17 10/13/17  Minna AntisPaduchowski, Kevin, MD  fluticasone (FLONASE) 50 MCG/ACT nasal spray Place 1 spray into both nostrils 2 (two) times daily. 08/27/18   Cuthriell, Delorise RoyalsJonathan D, PA-C  HYDROcodone-acetaminophen (NORCO) 5-325 MG tablet Take 1 tablet by mouth 3 (three) times daily as needed for up to 2 days. 11/10/18 11/12/18  Tarius Stangelo, Charlesetta IvoryJenise V Bacon, PA-C  meclizine (ANTIVERT) 12.5 MG tablet Take 1 tablet (12.5 mg total) by mouth 3 (three) times daily as needed for dizziness. 08/27/18   Cuthriell, Delorise RoyalsJonathan D, PA-C  Olopatadine HCl 0.2 % SOLN Apply 1 drop to eye daily as needed. 07/15/18   Enid DerryWagner, Ashley, PA-C    Allergies Patient has no known allergies.  History reviewed. No pertinent family history.  Social History Social History   Tobacco Use  . Smoking status: Never Smoker  . Smokeless tobacco: Never Used  Substance Use Topics  . Alcohol use: No  . Drug use: No    Review of Systems  Constitutional: Negative for fever. Eyes: Negative for visual changes. ENT: Negative for sore throat. Cardiovascular: Negative for chest pain. Respiratory: Negative for shortness of breath. Gastrointestinal: Negative for abdominal pain, vomiting and diarrhea. Genitourinary: Negative for dysuria. Musculoskeletal: Negative for back pain.  Left foot pain as above. Skin: Negative for rash. Neurological: Negative for headaches, focal weakness or numbness. ____________________________________________  PHYSICAL EXAM:  VITAL SIGNS: ED Triage Vitals  Enc Vitals Group     BP 11/10/18 1328 (!) 128/97     Pulse  Rate 11/10/18 1328 100     Resp --      Temp 11/10/18 1328 99 F (37.2 C)     Temp Source 11/10/18 1328 Oral     SpO2 11/10/18 1328 97 %     Weight 11/10/18 1326 280 lb (127 kg)     Height 11/10/18 1326 5\' 4"  (1.626 m)     Head Circumference --      Peak Flow --      Pain Score 11/10/18 1511 10     Pain Loc --      Pain Edu? --      Excl. in GC? --      Constitutional: Alert and oriented. Well appearing and in no distress. Head: Normocephalic and atraumatic. Eyes: Conjunctivae are normal. Normal extraocular movements Cardiovascular: Normal rate, regular rhythm. Normal distal pulses. Respiratory: Normal respiratory effort. No wheezes/rales/rhonchi. Musculoskeletal: Left foot without obvious deformity, dislocation, or joint effusion.  Patient has exquisite tender to palp over the left MTP of the first toe.  The areas also subtly erythematous and edematous on inspection.  Ankle exam is benign within normal limits.  Nontender with normal range of motion in all extremities.  Neurologic:  Normal gait without ataxia. Normal speech and language. No gross focal neurologic deficits are appreciated. Skin:  Skin is warm, dry and intact. No rash noted. ____________________________________________    LABS (pertinent positives/negatives) Labs Reviewed  URIC ACID - Abnormal; Notable for the following components:      Result Value   Uric Acid, Serum 7.7 (*)    All other components within normal limits  ____________________________________________   RADIOLOGY  Not indicated ____________________________________________  PROCEDURES  Procedures Diclofenac 50 mg PO Post Op Shoe ____________________________________________  INITIAL IMPRESSION / ASSESSMENT AND PLAN / ED COURSE  Amanda Hunter was evaluated in Emergency Department on 11/10/2018 for the symptoms described in the history of present illness. She was evaluated in the context of the global COVID-19 pandemic, which necessitated consideration that the patient might be at risk for infection with the SARS-CoV-2 virus that causes COVID-19. Institutional protocols and algorithms that pertain to the evaluation of patients at risk for COVID-19 are in a state of rapid change based on information released by regulatory bodies including the CDC and federal and state organizations. These policies and  algorithms were followed during the patient's care in the ED.  Patient with ED evaluation of sudden right great toe pain without known trauma.  Patient's clinical picture is concerning for possible gouty arthritis.  She had a large seafood meal the evening prior.  Also concerning her history is clinical confirmation of a seafood allergy.  She reports large hives several minutes after eating seafood on multiple occasions.  Patient is discharged at this time after her uric acid level is mildly elevated at 7.7, with a diagnosis of gout.  She is also discharged with an EpiPen to manage her presumed seafood allergy and prevent anaphylaxis.  She is given instructions on management of her allergies and low purine diet.  Should follow with primary provider return to the ED as discussed.  I reviewed the patient's prescription history over the last 12 months in the multi-state controlled substances database(s) that includes [place], [place], [place], [place], [place], [place], [place], [place], [place], [place], [place], [place], and [place].  Results were notable for no RX history. ____________________________________________  FINAL CLINICAL IMPRESSION(S) / ED DIAGNOSES  Final diagnoses:  Acute idiopathic gout involving toe of right foot  Seafood allergy      Carmie End, Dannielle Karvonen, PA-C 11/10/18 1522    Arta Silence, MD 11/10/18 [phone]

## 2018-11-25 ENCOUNTER — Other Ambulatory Visit: Payer: Self-pay

## 2018-11-25 ENCOUNTER — Encounter: Payer: Self-pay | Admitting: Emergency Medicine

## 2018-11-25 ENCOUNTER — Emergency Department
Admission: EM | Admit: 2018-11-25 | Discharge: 2018-11-25 | Disposition: A | Discharge status: Home / Self Care | Payer: Medicaid Other | Attending: Emergency Medicine | Admitting: Emergency Medicine

## 2018-11-25 DIAGNOSIS — I10 Essential (primary) hypertension: Secondary | ICD-10-CM | POA: Diagnosis not present

## 2018-11-25 DIAGNOSIS — Z79899 Other long term (current) drug therapy: Secondary | ICD-10-CM | POA: Insufficient documentation

## 2018-11-25 DIAGNOSIS — R21 Rash and other nonspecific skin eruption: Secondary | ICD-10-CM | POA: Insufficient documentation

## 2018-11-25 LAB — GROUP A STREP BY PCR: Group A Strep by PCR: NOT DETECTED

## 2018-11-25 MED ORDER — SALICYLIC ACID 2 % EX CREA: 1.0000 "application " | TOPICAL_CREAM | Freq: Every day | CUTANEOUS | 0 refills | Status: DC

## 2018-11-25 NOTE — ED Triage Notes (Signed)
Pt presents to ED via POV with c/o rash to abdomen and back. NAD noted at this time. Pt states rash started several days ago, denies pain

## 2018-11-25 NOTE — ED Notes (Signed)
See triage note  Presents with rash to back and chest with slight itching  Sx's started couple of days ago   Denies any SOB or diff swallowing  Afebrile on arrival

## 2018-11-25 NOTE — ED Provider Notes (Signed)
[facility] Emergency Department Provider Note  ____________________________________________  Time seen: Approximately 6:43 PM  I have reviewed the triage vital signs and the nursing notes.   HISTORY  Chief Complaint Rash    HPI Amanda Hunter is a 33 y.o. female presents to the emergency department with a papular, pruritic rash of neck and back that is been present for the past 4 to 5 days.  Patient denies fever, chills or pharyngitis.  She denies headache.  She states that she has not experienced similar symptoms in the past.  No new contact exposures to soaps, make-up or laundry detergent.  Patient denies known tick bites.  No alleviating measures have been attempted.        Past Medical History:  Diagnosis Date  . Hypertension     There are no active problems to display for this patient.   History reviewed. No pertinent surgical history.  Prior to Admission medications   Medication Sig Start Date End Date Taking? Authorizing Provider  diclofenac (VOLTAREN) 50 MG EC tablet Take 1 tablet (50 mg total) by mouth 2 (two) times daily as needed for up to 15 days. 11/10/18 11/25/18  Menshew, Dannielle Karvonen, PA-C  EPINEPHrine 0.3 mg/0.3 mL IJ SOAJ injection Inject 0.3 mLs (0.3 mg total) into the muscle as needed for up to 2 doses for anaphylaxis. Inject into the upper thigh 11/10/18   Menshew, Dannielle Karvonen, PA-C  ferrous sulfate 325 (65 FE) MG EC tablet Take 1 tablet (325 mg total) by mouth 2 (two) times daily. 09/13/17 10/13/17  Harvest Dark, MD  fluticasone (FLONASE) 50 MCG/ACT nasal spray Place 1 spray into both nostrils 2 (two) times daily. 08/27/18   Cuthriell, Charline Bills, PA-C  meclizine (ANTIVERT) 12.5 MG tablet Take 1 tablet (12.5 mg total) by mouth 3 (three) times daily as needed for dizziness. 08/27/18   Cuthriell, Charline Bills, PA-C  Olopatadine HCl 0.2 % SOLN Apply 1 drop to eye daily as needed. 07/15/18   Laban Emperor, PA-C  Salicylic Acid 2 % CREA  Apply 1 application topically daily. 11/25/18   Lannie Fields, PA-C    Allergies Patient has no known allergies.  No family history on file.  Social History Social History   Tobacco Use  . Smoking status: Never Smoker  . Smokeless tobacco: Never Used  Substance Use Topics  . Alcohol use: No  . Drug use: No     Review of Systems  Constitutional: No fever/chills Eyes: No visual changes. No discharge ENT: No upper respiratory complaints. Cardiovascular: no chest pain. Respiratory: no cough. No SOB. Gastrointestinal: No abdominal pain.  No nausea, no vomiting.  No diarrhea.  No constipation. Musculoskeletal: Negative for musculoskeletal pain. Skin: Patient has rash.  Neurological: Negative for headaches, focal weakness or numbness.   ____________________________________________   PHYSICAL EXAM:  VITAL SIGNS: ED Triage Vitals  Enc Vitals Group     BP 11/25/18 1723 (!) 146/93     Pulse Rate 11/25/18 1723 (!) 103     Resp 11/25/18 1723 18     Temp 11/25/18 1723 98.8 F (37.1 C)     Temp Source 11/25/18 1723 Oral     SpO2 11/25/18 1723 96 %     Weight 11/25/18 1720 280 lb (127 kg)     Height 11/25/18 1720 5\' 4"  (1.626 m)     Head Circumference --      Peak Flow --      Pain Score 11/25/18 1720 0  Pain Loc --      Pain Edu? --      Excl. in GC? --      Constitutional: Alert and oriented. Well appearing and in no acute distress. Eyes: Conjunctivae are normal. PERRL. EOMI. Head: Atraumatic. Cardiovascular: Normal rate, regular rhythm. Normal S1 and S2.  Good peripheral circulation. Respiratory: Normal respiratory effort without tachypnea or retractions. Lungs CTAB. Good air entry to the bases with no decreased or absent breath sounds. Musculoskeletal: Full range of motion to all extremities. No gross deformities appreciated. Neurologic:  Normal speech and language. No gross focal neurologic deficits are appreciated.  Skin: Patient has papular rash of neck and  back.  No erythema.  No vesicle formation. Psychiatric: Mood and affect are normal. Speech and behavior are normal. Patient exhibits appropriate insight and judgement.   ____________________________________________   LABS (all labs ordered are listed, but only abnormal results are displayed)  Labs Reviewed  GROUP A STREP BY PCR   ____________________________________________  EKG   ____________________________________________  RADIOLOGY   No results found.  ____________________________________________    PROCEDURES  Procedure(s) performed:    Procedures    Medications - No data to display   ____________________________________________   INITIAL IMPRESSION / ASSESSMENT AND PLAN / ED COURSE  Pertinent labs & imaging results that were available during my care of the patient were reviewed by me and considered in my medical decision making (see chart for details).  Review of the [place] CSRS was performed in accordance of the [facility] prior to dispensing any controlled drugs.         Assessment and Plan:  Rash:  Presents to the emergency department with papular rash of neck and back.  Patient was mildly hypertensive at triage but vital signs were otherwise reassuring.  Differential diagnosis includes scarlatina versus keratosis pilaris.  Group A strep testing was negative in the emergency department.  Patient was discharged with salicylic acid ointment.  She was advised to follow-up with primary care as needed.  All patient questions were answered.   ____________________________________________  FINAL CLINICAL IMPRESSION(S) / ED DIAGNOSES  Final diagnoses:  Rash      NEW MEDICATIONS STARTED DURING THIS VISIT:  ED Discharge Orders         Ordered    Salicylic Acid 2 % CREA  Daily     11/25/18 1943              This chart was dictated using voice recognition software/Dragon. Despite best efforts to proofread, errors can occur which can change the  meaning. Any change was purely unintentional.    Gasper LloydWoods, Debroh Sieloff M, PA-C 11/25/18 2046    Shaune PollackIsaacs, Cameron, MD 11/26/18 1030

## 2018-12-18 ENCOUNTER — Other Ambulatory Visit: Payer: Self-pay

## 2018-12-18 ENCOUNTER — Emergency Department
Admission: EM | Admit: 2018-12-18 | Discharge: 2018-12-18 | Disposition: A | Discharge status: Home / Self Care | Payer: Medicaid Other | Attending: Emergency Medicine | Admitting: Emergency Medicine

## 2018-12-18 ENCOUNTER — Encounter: Payer: Self-pay | Admitting: Emergency Medicine

## 2018-12-18 DIAGNOSIS — I10 Essential (primary) hypertension: Secondary | ICD-10-CM | POA: Diagnosis not present

## 2018-12-18 DIAGNOSIS — M542 Cervicalgia: Secondary | ICD-10-CM | POA: Diagnosis present

## 2018-12-18 DIAGNOSIS — M436 Torticollis: Secondary | ICD-10-CM | POA: Diagnosis not present

## 2018-12-18 DIAGNOSIS — Z79899 Other long term (current) drug therapy: Secondary | ICD-10-CM | POA: Insufficient documentation

## 2018-12-18 MED ORDER — BACLOFEN 10 MG PO TABS: 10.0000 mg | ORAL_TABLET | Freq: Every day | ORAL | 1 refills | Status: DC

## 2018-12-18 MED ORDER — MELOXICAM 15 MG PO TABS: 15.0000 mg | ORAL_TABLET | Freq: Every day | ORAL | 0 refills | Status: DC

## 2018-12-18 NOTE — ED Triage Notes (Signed)
C/O muscular pain to neck.  Onset of symptoms this morning.  Denies injury.  AAOx3.  Skin warm and dry. NAD

## 2018-12-18 NOTE — ED Provider Notes (Signed)
[facility] Emergency Department Provider Note  ____________________________________________   First MD Initiated Contact with Patient 12/18/18 1619     (approximate)  I have reviewed the triage vital signs and the nursing notes.   HISTORY  Chief Complaint Torticollis    HPI Akili Cuda is a 33 y.o. female presents emergency department complaining of neck pain.  States she slept wrong it is been difficult to turn her neck with palpation.  She denies any known injury.  She denies any fever or chills.   She denies any numbness or tingling.   Past Medical History:  Diagnosis Date  . Hypertension     There are no active problems to display for this patient.   History reviewed. No pertinent surgical history.  Prior to Admission medications   Medication Sig Start Date End Date Taking? Authorizing Provider  baclofen (LIORESAL) 10 MG tablet Take 1 tablet (10 mg total) by mouth daily. 12/18/18 12/18/19  Mazelle Huebert, Linden Dolin, PA-C  EPINEPHrine 0.3 mg/0.3 mL IJ SOAJ injection Inject 0.3 mLs (0.3 mg total) into the muscle as needed for up to 2 doses for anaphylaxis. Inject into the upper thigh 11/10/18   Menshew, Dannielle Karvonen, PA-C  ferrous sulfate 325 (65 FE) MG EC tablet Take 1 tablet (325 mg total) by mouth 2 (two) times daily. 09/13/17 10/13/17  Harvest Dark, MD  meloxicam (MOBIC) 15 MG tablet Take 1 tablet (15 mg total) by mouth daily. 12/18/18 12/18/19  Carylon Tamburro, Linden Dolin, PA-C  fluticasone (FLONASE) 50 MCG/ACT nasal spray Place 1 spray into both nostrils 2 (two) times daily. 08/27/18 12/18/18  Cuthriell, Charline Bills, PA-C    Allergies Patient has no known allergies.  History reviewed. No pertinent family history.  Social History Social History   Tobacco Use  . Smoking status: Never Smoker  . Smokeless tobacco: Never Used  Substance Use Topics  . Alcohol use: No  . Drug use: No    Review of Systems  Constitutional: No fever/chills Eyes: No visual  changes. ENT: No sore throat. Respiratory: Denies cough Genitourinary: Negative for dysuria. Musculoskeletal: Negative for back pain.  Positive for neck pain Skin: Negative for rash.    ____________________________________________   PHYSICAL EXAM:  VITAL SIGNS: ED Triage Vitals  Enc Vitals Group     BP 12/18/18 1614 127/89     Pulse Rate 12/18/18 1614 91     Resp 12/18/18 1614 16     Temp 12/18/18 1614 98.1 F (36.7 C)     Temp Source 12/18/18 1614 Oral     SpO2 12/18/18 1614 98 %     Weight 12/18/18 1612 279 lb 15.8 oz (127 kg)     Height 12/18/18 1612 5\' 4"  (1.626 m)     Head Circumference --      Peak Flow --      Pain Score 12/18/18 1612 9     Pain Loc --      Pain Edu? --      Excl. in [place]? --     Constitutional: Alert and oriented. Well appearing and in no acute distress. Eyes: Conjunctivae are normal.  Head: Atraumatic. Nose: No congestion/rhinnorhea. Mouth/Throat: Mucous membranes are moist.   Neck:  supple no lymphadenopathy noted Cardiovascular: Normal rate, regular rhythm.  Respiratory: Normal respiratory effort.  No retractions,  GU: deferred Musculoskeletal: FROM all extremities, warm and well perfused, decreased range of motion of the neck with rotation to the left and right, C-spine is nontender, multiple spasms noted  in the upper shoulders along the trapezius and supraspinatus.  Grips are equal bilaterally Neurologic:  Normal speech and language.  Skin:  Skin is warm, dry and intact. No rash noted. Psychiatric: Mood and affect are normal. Speech and behavior are normal.  ____________________________________________   LABS (all labs ordered are listed, but only abnormal results are displayed)  Labs Reviewed - No data to display ____________________________________________   ____________________________________________  RADIOLOGY    ____________________________________________   PROCEDURES  Procedure(s) performed: No  Procedures     ____________________________________________   INITIAL IMPRESSION / ASSESSMENT AND PLAN / ED COURSE  Pertinent labs & imaging results that were available during my care of the patient were reviewed by me and considered in my medical decision making (see chart for details).   Patient is 33 year old female presents emergency department with complaints of neck pain.  Physical exam shows patient appears well.  Vitals are normal.  Patient has difficulty with rotating the neck to the left and right secondary to discomfort.  muscle spasms in the upper shoulders and neck.  Remainder the exam is unremarkable  Explained the findings to the patient.  Discussed exercises to help loosen the muscles.  She was given a prescription for Mobic and baclofen.  She is to follow-up with her regular doctor if not better in 3 to 5 days.  Return to the emergency department if worsening.  She states she understands will comply.  She is discharged in stable condition.    Junius FinnerKyana Stembridge was evaluated in Emergency Department on 12/18/2018 for the symptoms described in the history of present illness. She was evaluated in the context of the global COVID-19 pandemic, which necessitated consideration that the patient might be at risk for infection with the SARS-CoV-2 virus that causes COVID-19. Institutional protocols and algorithms that pertain to the evaluation of patients at risk for COVID-19 are in a state of rapid change based on information released by regulatory bodies including the CDC and federal and state organizations. These policies and algorithms were followed during the patient's care in the ED.   As part of my medical decision making, I reviewed the following data within the electronic MEDICAL RECORD NUMBER Nursing notes reviewed and incorporated, Old chart reviewed, Notes from prior ED visits and [place] Controlled Substance Database  ____________________________________________   FINAL CLINICAL IMPRESSION(S) / ED  DIAGNOSES  Final diagnoses:  Torticollis, acute      NEW MEDICATIONS STARTED DURING THIS VISIT:  New Prescriptions   BACLOFEN (LIORESAL) 10 MG TABLET    Take 1 tablet (10 mg total) by mouth daily.   MELOXICAM (MOBIC) 15 MG TABLET    Take 1 tablet (15 mg total) by mouth daily.     Note:  This document was prepared using Dragon voice recognition software and may include unintentional dictation errors.    Faythe GheeFisher, Johnnae Impastato W, PA-C 12/18/18 1634    Chesley NoonJessup, Charles, MD 12/18/18 [id number]

## 2018-12-18 NOTE — Discharge Instructions (Addendum)
Follow-up with your regular doctor if not better in 5 to 7 days.  Return emergency department if worsening.  Take your medications as prescribed.  Try to do the neck exercises we discussed.

## 2019-03-21 ENCOUNTER — Ambulatory Visit: Payer: Self-pay

## 2019-03-25 ENCOUNTER — Other Ambulatory Visit: Payer: Self-pay

## 2019-03-25 ENCOUNTER — Emergency Department
Admission: EM | Admit: 2019-03-25 | Discharge: 2019-03-25 | Disposition: A | Discharge status: Home / Self Care | Payer: Medicaid Other | Attending: Emergency Medicine | Admitting: Emergency Medicine

## 2019-03-25 DIAGNOSIS — R110 Nausea: Secondary | ICD-10-CM | POA: Diagnosis not present

## 2019-03-25 DIAGNOSIS — I10 Essential (primary) hypertension: Secondary | ICD-10-CM | POA: Insufficient documentation

## 2019-03-25 DIAGNOSIS — Z3202 Encounter for pregnancy test, result negative: Secondary | ICD-10-CM | POA: Insufficient documentation

## 2019-03-25 DIAGNOSIS — R109 Unspecified abdominal pain: Secondary | ICD-10-CM

## 2019-03-25 DIAGNOSIS — R1010 Upper abdominal pain, unspecified: Secondary | ICD-10-CM | POA: Insufficient documentation

## 2019-03-25 LAB — URINALYSIS, COMPLETE (UACMP) WITH MICROSCOPIC
Bacteria, UA: NONE SEEN
Bilirubin Urine: NEGATIVE
Glucose, UA: NEGATIVE mg/dL
Ketones, ur: NEGATIVE mg/dL
Nitrite: NEGATIVE
Protein, ur: NEGATIVE mg/dL
Specific Gravity, Urine: 1.013 (ref 1.005–1.030)
pH: 5 (ref 5.0–8.0)

## 2019-03-25 LAB — COMPREHENSIVE METABOLIC PANEL
ALT: 28 U/L (ref 0–44)
AST: 23 U/L (ref 15–41)
Albumin: 4 g/dL (ref 3.5–5.0)
Alkaline Phosphatase: 55 U/L (ref 38–126)
Anion gap: 8 (ref 5–15)
BUN: 7 mg/dL (ref 6–20)
CO2: 27 mmol/L (ref 22–32)
Calcium: 9.2 mg/dL (ref 8.9–10.3)
Chloride: 105 mmol/L (ref 98–111)
Creatinine, Ser: 0.79 mg/dL (ref 0.44–1.00)
GFR calc Af Amer: >60 mL/min (ref >60)
GFR calc non Af Amer: >60 mL/min (ref >60)
Glucose, Bld: 99 mg/dL (ref 70–99)
Potassium: 3.7 mmol/L (ref 3.5–5.1)
Sodium: 140 mmol/L (ref 135–145)
Total Bilirubin: 0.6 mg/dL (ref 0.3–1.2)
Total Protein: 7.7 g/dL (ref 6.5–8.1)

## 2019-03-25 LAB — LIPASE, BLOOD: Lipase: 22 U/L (ref 11–51)

## 2019-03-25 LAB — CBC
HCT: 38.7 % (ref 36.0–46.0)
Hemoglobin: 12.4 g/dL (ref 12.0–15.0)
MCH: 23.2 pg — ABNORMAL LOW (ref 26.0–34.0)
MCHC: 32 g/dL (ref 30.0–36.0)
MCV: 72.3 fL — ABNORMAL LOW (ref 80.0–100.0)
Platelets: 371 10*3/uL (ref 150–400)
RBC: 5.35 MIL/uL — ABNORMAL HIGH (ref 3.87–5.11)
RDW: 16 % — ABNORMAL HIGH (ref 11.5–15.5)
WBC: 11.2 10*3/uL — ABNORMAL HIGH (ref 4.0–10.5)
nRBC: 0 % (ref 0.0–0.2)

## 2019-03-25 LAB — POCT PREGNANCY, URINE: Preg Test, Ur: NEGATIVE

## 2019-03-25 LAB — HCG, QUANTITATIVE, PREGNANCY: hCG, Beta Chain, Quant, S: <1 m[IU]/mL (ref <5)

## 2019-03-25 MED ORDER — SODIUM CHLORIDE 0.9% FLUSH: 3.0000 mL | Freq: Once | INTRAVENOUS | Status: DC

## 2019-03-25 NOTE — ED Provider Notes (Signed)
[facility] Emergency Department Provider Note  ____________________________________________  Time seen: Approximately 3:03 PM  I have reviewed the triage vital signs and the nursing notes.   HISTORY  Chief Complaint Abdominal Pain    HPI Amanda Hunter is a 33 y.o. female who presents to the emergency department for treatment and evaluation of transverse upper abdominal pain that started 3 days ago.  Patient states that it feels like a cramping sensation.  She thought that she was starting her menstrual cycle as it is about that time.  She states that Friday she had some spotting but never really started her cycle.  This is continued over the past 3 days.  She took a pregnancy test at home today and had a "faint line."  She came to the emergency department for confirmation.  She is also had some nausea without any vomiting.  No diarrhea.  No fever.  She has had 5 pregnancies and has 4 children at home.   Past Medical History:  Diagnosis Date  . Hypertension     There are no problems to display for this patient.   History reviewed. No pertinent surgical history.  Prior to Admission medications   Medication Sig Start Date End Date Taking? Authorizing Provider  EPINEPHrine 0.3 mg/0.3 mL IJ SOAJ injection Inject 0.3 mLs (0.3 mg total) into the muscle as needed for up to 2 doses for anaphylaxis. Inject into the upper thigh 11/10/18   Menshew, Dannielle Karvonen, PA-C  fluticasone (FLONASE) 50 MCG/ACT nasal spray Place 1 spray into both nostrils 2 (two) times daily. 08/27/18 12/18/18  Cuthriell, Charline Bills, PA-C    Allergies Patient has no known allergies.  History reviewed. No pertinent family history.  Social History Social History   Tobacco Use  . Smoking status: Never Smoker  . Smokeless tobacco: Never Used  Substance Use Topics  . Alcohol use: No  . Drug use: No    Review of Systems Constitutional: Negative for fever. Respiratory: Negative for  shortness of breath or cough. Gastrointestinal: Positive for abdominal pain; positive for nausea , negative for vomiting. Genitourinary: Negative for dysuria , negative for vaginal discharge. Musculoskeletal: Negative for back pain. Skin: Negative for acute skin changes/rash/lesion. ____________________________________________   PHYSICAL EXAM:  VITAL SIGNS: ED Triage Vitals  Enc Vitals Group     BP 03/25/19 1401 137/81     Pulse Rate 03/25/19 1401 94     Resp 03/25/19 1401 20     Temp 03/25/19 1401 99.2 F (37.3 C)     Temp Source 03/25/19 1401 Oral     SpO2 --      Weight 03/25/19 1403 230 lb (104.3 kg)     Height 03/25/19 1403 5\' 4"  (1.626 m)     Head Circumference --      Peak Flow --      Pain Score 03/25/19 1403 10     Pain Loc --      Pain Edu? --      Excl. in [place]? --     Constitutional: Alert and oriented. Well appearing and in no acute distress. Eyes: Conjunctivae are normal. Head: Atraumatic. Nose: No congestion/rhinnorhea. Mouth/Throat: Mucous membranes are moist. Respiratory: Normal respiratory effort.  No retractions. Gastrointestinal: Bowel sounds active x 4; Abdomen is soft without rebound or guarding. Genitourinary: Pelvic exam: not indicated. Musculoskeletal: No extremity tenderness nor edema.  Neurologic:  Normal speech and language. No gross focal neurologic deficits are appreciated. Speech is normal. No gait instability. Skin:  Skin is warm, dry and intact. No rash noted on exposed skin. Psychiatric: Mood and affect are normal. Speech and behavior are normal.  ____________________________________________   LABS (all labs ordered are listed, but only abnormal results are displayed)  Labs Reviewed  CBC - Abnormal; Notable for the following components:      Result Value   WBC 11.2 (*)    RBC 5.35 (*)    MCV 72.3 (*)    MCH 23.2 (*)    RDW 16.0 (*)    All other components within normal limits  URINALYSIS, COMPLETE (UACMP) WITH MICROSCOPIC -  Abnormal; Notable for the following components:   Color, Urine YELLOW (*)    APPearance CLEAR (*)    Hgb urine dipstick MODERATE (*)    Leukocytes,Ua TRACE (*)    All other components within normal limits  LIPASE, BLOOD  COMPREHENSIVE METABOLIC PANEL  HCG, QUANTITATIVE, PREGNANCY  POCT PREGNANCY, URINE   ____________________________________________  RADIOLOGY  Not indicated. ____________________________________________  Procedures  ____________________________________________  33 year old female presenting to the emergency department for concern of pregnancy.  Urine pregnancy is negative.  Urinalysis shows trace leukocytes and some hemoglobin but otherwise is negative.  CBC and BMP are without findings of concern.  Because patient did have a "light pink line" from her home pregnancy test, beta hCG added to labs already drawn.  Patient care transferred to Gala Romney, PA-C for final treatment and disposition.  INITIAL IMPRESSION / ASSESSMENT AND PLAN / ED COURSE  Pertinent labs & imaging results that were available during my care of the patient were reviewed by me and considered in my medical decision making (see chart for details).  ____________________________________________   FINAL CLINICAL IMPRESSION(S) / ED DIAGNOSES  Final diagnoses:  Abdominal cramping    Note:  This document was prepared using Dragon voice recognition software and may include unintentional dictation errors.   Chinita Pester, FNP 03/25/19 2158    Phineas Semen, MD 03/25/19 [phone]

## 2019-03-25 NOTE — ED Triage Notes (Signed)
Pt c/o of upper abdominal pain x4 days and nausea. Pt denies vomiting, diarrhea. Pt is aox4, nad noted.

## 2019-03-25 NOTE — ED Notes (Signed)
See triage note  Presents with generalized abd pain   States pain started about 4 days ago  Low grade fever  Describes pain as "cramping"type   Positive nausea

## 2019-03-25 NOTE — ED Provider Notes (Signed)
-----------------------------------------   4:02 PM on 03/25/2019 -----------------------------------------  Blood pressure 137/81, pulse 94, temperature 99.2 F (37.3 C), temperature source Oral, resp. rate 20, height 5\' 4"  (1.626 m), weight 104.3 kg, last menstrual period 03/23/2019.  Assuming care from Sherrie George, [place].  In short, Amanda Hunter is a 33 y.o. female with a chief complaint of Abdominal Pain .  Refer to the original H&P for additional details.  The current plan of care is to await labs.  Patient presented to emergency department complaining of abdominal cramping, possible low-grade fever.  Patient denied any nausea, vomiting, diarrhea, constipation, urinary symptoms.  Patient was concerned that she may be pregnant as she has had 5 previous pregnancies with similar symptoms.  Patient took a home pregnancy test that had a "faint line" and presents the emergency department for further evaluation.  Initial urine pregnancy test was negative, however hCG testing is ordered.  Awaiting these results.  ----------------------------------------- 4:23 PM on 03/25/2019 -----------------------------------------  hCG returns less than 1.  Patient is not pregnant.  She states that this is approximately the time that she is supposed to start her menstrual cycle.  I suspect that the cramping sensation that patient is experiencing is likely menstrual cycle.  Differential included viral gastroenteritis, diverticulitis, appendicitis, cholecystitis, pregnancy, UTI, menstrual cramps.   Diagnosis:  Abdominal cramping       Darletta Moll, PA-C 03/25/19 1639    Duffy Bruce, MD 03/26/19 [phone]

## 2019-04-07 ENCOUNTER — Emergency Department: Payer: Medicaid Other

## 2019-04-07 ENCOUNTER — Other Ambulatory Visit: Payer: Self-pay

## 2019-04-07 ENCOUNTER — Emergency Department
Admission: EM | Admit: 2019-04-07 | Discharge: 2019-04-07 | Disposition: A | Discharge status: Home / Self Care | Payer: Medicaid Other | Attending: Student in an Organized Health Care Education/Training Program | Admitting: Student in an Organized Health Care Education/Training Program

## 2019-04-07 DIAGNOSIS — Z20828 Contact with and (suspected) exposure to other viral communicable diseases: Secondary | ICD-10-CM | POA: Insufficient documentation

## 2019-04-07 DIAGNOSIS — R1084 Generalized abdominal pain: Secondary | ICD-10-CM | POA: Diagnosis not present

## 2019-04-07 DIAGNOSIS — I10 Essential (primary) hypertension: Secondary | ICD-10-CM | POA: Diagnosis not present

## 2019-04-07 LAB — URINALYSIS, COMPLETE (UACMP) WITH MICROSCOPIC
Bacteria, UA: NONE SEEN
Bilirubin Urine: NEGATIVE
Glucose, UA: NEGATIVE mg/dL
Hgb urine dipstick: NEGATIVE
Ketones, ur: 5 mg/dL — AB
Nitrite: NEGATIVE
Protein, ur: NEGATIVE mg/dL
Specific Gravity, Urine: 1.019 (ref 1.005–1.030)
pH: 5 (ref 5.0–8.0)

## 2019-04-07 LAB — COMPREHENSIVE METABOLIC PANEL
ALT: 21 U/L (ref 0–44)
AST: 25 U/L (ref 15–41)
Albumin: 4.1 g/dL (ref 3.5–5.0)
Alkaline Phosphatase: 58 U/L (ref 38–126)
Anion gap: 11 (ref 5–15)
BUN: 11 mg/dL (ref 6–20)
CO2: 27 mmol/L (ref 22–32)
Calcium: 9.1 mg/dL (ref 8.9–10.3)
Chloride: 101 mmol/L (ref 98–111)
Creatinine, Ser: 0.84 mg/dL (ref 0.44–1.00)
GFR calc Af Amer: >60 mL/min (ref >60)
GFR calc non Af Amer: >60 mL/min (ref >60)
Glucose, Bld: 98 mg/dL (ref 70–99)
Potassium: 4 mmol/L (ref 3.5–5.1)
Sodium: 139 mmol/L (ref 135–145)
Total Bilirubin: 0.7 mg/dL (ref 0.3–1.2)
Total Protein: 7.7 g/dL (ref 6.5–8.1)

## 2019-04-07 LAB — CBC
HCT: 38.5 % (ref 36.0–46.0)
Hemoglobin: 12.3 g/dL (ref 12.0–15.0)
MCH: 23.1 pg — ABNORMAL LOW (ref 26.0–34.0)
MCHC: 31.9 g/dL (ref 30.0–36.0)
MCV: 72.2 fL — ABNORMAL LOW (ref 80.0–100.0)
Platelets: 414 10*3/uL — ABNORMAL HIGH (ref 150–400)
RBC: 5.33 MIL/uL — ABNORMAL HIGH (ref 3.87–5.11)
RDW: 15.9 % — ABNORMAL HIGH (ref 11.5–15.5)
WBC: 13.4 10*3/uL — ABNORMAL HIGH (ref 4.0–10.5)
nRBC: 0 % (ref 0.0–0.2)

## 2019-04-07 LAB — PREGNANCY, URINE: Preg Test, Ur: NEGATIVE

## 2019-04-07 LAB — LIPASE, BLOOD: Lipase: 24 U/L (ref 11–51)

## 2019-04-07 MED ORDER — SODIUM CHLORIDE 0.9% FLUSH: 3.0000 mL | Freq: Once | INTRAVENOUS | Status: DC

## 2019-04-07 MED ORDER — MORPHINE SULFATE (PF) 4 MG/ML IV SOLN
4.0000 mg | INTRAVENOUS | Status: DC | PRN
  Filled 2019-04-07: qty 1

## 2019-04-07 MED ORDER — FAMOTIDINE 20 MG PO TABS: 20.0000 mg | ORAL_TABLET | Freq: Every day | ORAL | 1 refills | Status: DC

## 2019-04-07 MED ORDER — ACETAMINOPHEN 325 MG PO TABS
650.0000 mg | ORAL_TABLET | Freq: Once | ORAL | Status: AC
  Administered 2019-04-07: 650 mg via ORAL
  Filled 2019-04-07: qty 2

## 2019-04-07 MED ORDER — LIDOCAINE VISCOUS HCL 2 % MT SOLN
15.0000 mL | Freq: Once | OROMUCOSAL | Status: AC
  Administered 2019-04-07: 15 mL via ORAL
  Filled 2019-04-07: qty 15

## 2019-04-07 MED ORDER — ONDANSETRON 4 MG PO TBDP
4.0000 mg | ORAL_TABLET | Freq: Once | ORAL | Status: DC
  Filled 2019-04-07: qty 1

## 2019-04-07 MED ORDER — IOHEXOL 300 MG/ML  SOLN
125.0000 mL | Freq: Once | INTRAMUSCULAR | Status: AC | PRN
  Administered 2019-04-07: 125 mL via INTRAVENOUS
  Filled 2019-04-07: qty 125

## 2019-04-07 MED ORDER — ALUM & MAG HYDROXIDE-SIMETH 200-200-20 MG/5ML PO SUSP
30.0000 mL | Freq: Once | ORAL | Status: AC
  Administered 2019-04-07: 30 mL via ORAL

## 2019-04-07 NOTE — ED Notes (Signed)
Patient is in CT.

## 2019-04-07 NOTE — Discharge Instructions (Signed)

## 2019-04-07 NOTE — ED Triage Notes (Signed)
Pt c/o lower abd pain with lower back pain since 1pm today. Denies N/V/D.Marland Kitchen

## 2019-04-07 NOTE — ED Provider Notes (Signed)
[facility] Emergency Department Provider Note    First MD Initiated Contact with Patient 04/07/19 1907     (approximate)  I have reviewed the triage vital signs and the nursing notes.   HISTORY  Chief Complaint Abdominal Pain    HPI Amanda Hunter is a 34 y.o. female close past medical history presents the ER for evaluation of crampy abdominal pain as well as low back pain.  No lateralizing pain.  Denies any dysuria.  No vomiting.  Has had some nausea.  Denies any cough congestion or shortness of breath.  No chest pain.  Is unsure if she is pregnant or not.  Denies any vaginal discharge or bleeding.    Past Medical History:  Diagnosis Date  . Hypertension    No family history on file. History reviewed. No pertinent surgical history. There are no problems to display for this patient.     Prior to Admission medications   Medication Sig Start Date End Date Taking? Authorizing Provider  EPINEPHrine 0.3 mg/0.3 mL IJ SOAJ injection Inject 0.3 mLs (0.3 mg total) into the muscle as needed for up to 2 doses for anaphylaxis. Inject into the upper thigh 11/10/18   Menshew, Charlesetta Ivory, PA-C  famotidine (PEPCID) 20 MG tablet Take 1 tablet (20 mg total) by mouth daily. 04/07/19 04/06/20  Willy Eddy, MD  fluticasone (FLONASE) 50 MCG/ACT nasal spray Place 1 spray into both nostrils 2 (two) times daily. 08/27/18 12/18/18  Cuthriell, Delorise Royals, PA-C    Allergies Patient has no known allergies.    Social History Social History   Tobacco Use  . Smoking status: Never Smoker  . Smokeless tobacco: Never Used  Substance Use Topics  . Alcohol use: No  . Drug use: No    Review of Systems Patient denies headaches, rhinorrhea, blurry vision, numbness, shortness of breath, chest pain, edema, cough, abdominal pain, nausea, vomiting, diarrhea, dysuria, fevers, rashes or hallucinations unless otherwise stated above in  HPI. ____________________________________________   PHYSICAL EXAM:  VITAL SIGNS: Vitals:   04/07/19 1832  BP: (!) 137/93  Pulse: 93  Resp: 19  Temp: 98.3 F (36.8 C)  SpO2: 100%    Constitutional: Alert and oriented.  Eyes: Conjunctivae are normal.  Head: Atraumatic. Nose: No congestion/rhinnorhea. Mouth/Throat: Mucous membranes are moist.   Neck: No stridor. Painless ROM.  Cardiovascular: Normal rate, regular rhythm. Grossly normal heart sounds.  Good peripheral circulation. Respiratory: Normal respiratory effort.  No retractions. Lungs CTAB. Gastrointestinal: Soft obese but without focal ttp, no guarding or rebound.. No distention. No abdominal bruits. No CVA tenderness. Genitourinary:  Musculoskeletal: No lower extremity tenderness nor edema.  No joint effusions. Neurologic:  Normal speech and language. No gross focal neurologic deficits are appreciated. No facial droop Skin:  Skin is warm, dry and intact. No rash noted. Psychiatric: Mood and affect are normal. Speech and behavior are normal.  ____________________________________________   LABS (all labs ordered are listed, but only abnormal results are displayed)  Results for orders placed or performed during the hospital encounter of 04/07/19 (from the past 24 hour(s))  Pregnancy, urine     Status: None   Collection Time: 04/07/19  6:20 PM  Result Value Ref Range   Preg Test, Ur NEGATIVE NEGATIVE  Urinalysis, Complete w Microscopic     Status: Abnormal   Collection Time: 04/07/19  6:42 PM  Result Value Ref Range   Color, Urine YELLOW (A) YELLOW   APPearance CLEAR (A) CLEAR   Specific Gravity,  Urine 1.019 1.005 - 1.030   pH 5.0 5.0 - 8.0   Glucose, UA NEGATIVE NEGATIVE mg/dL   Hgb urine dipstick NEGATIVE NEGATIVE   Bilirubin Urine NEGATIVE NEGATIVE   Ketones, ur 5 (A) NEGATIVE mg/dL   Protein, ur NEGATIVE NEGATIVE mg/dL   Nitrite NEGATIVE NEGATIVE   Leukocytes,Ua TRACE (A) NEGATIVE   RBC / HPF 0-5 0 - 5  RBC/hpf   WBC, UA 0-5 0 - 5 WBC/hpf   Bacteria, UA NONE SEEN NONE SEEN   Squamous Epithelial / LPF 6-10 0 - 5   Mucus PRESENT   Lipase, blood     Status: None   Collection Time: 04/07/19  7:20 PM  Result Value Ref Range   Lipase 24 11 - 51 U/L  Comprehensive metabolic panel     Status: None   Collection Time: 04/07/19  7:20 PM  Result Value Ref Range   Sodium 139 135 - 145 mmol/L   Potassium 4.0 3.5 - 5.1 mmol/L   Chloride 101 98 - 111 mmol/L   CO2 27 22 - 32 mmol/L   Glucose, Bld 98 70 - 99 mg/dL   BUN 11 6 - 20 mg/dL   Creatinine, Ser 0.84 0.44 - 1.00 mg/dL   Calcium 9.1 8.9 - 10.3 mg/dL   Total Protein 7.7 6.5 - 8.1 g/dL   Albumin 4.1 3.5 - 5.0 g/dL   AST 25 15 - 41 U/L   ALT 21 0 - 44 U/L   Alkaline Phosphatase 58 38 - 126 U/L   Total Bilirubin 0.7 0.3 - 1.2 mg/dL   GFR calc non Af Amer >60 >60 mL/min   GFR calc Af Amer >60 >60 mL/min   Anion gap 11 5 - 15  CBC     Status: Abnormal   Collection Time: 04/07/19  7:20 PM  Result Value Ref Range   WBC 13.4 (H) 4.0 - 10.5 K/uL   RBC 5.33 (H) 3.87 - 5.11 MIL/uL   Hemoglobin 12.3 12.0 - 15.0 g/dL   HCT 38.5 36.0 - 46.0 %   MCV 72.2 (L) 80.0 - 100.0 fL   MCH 23.1 (L) 26.0 - 34.0 pg   MCHC 31.9 30.0 - 36.0 g/dL   RDW 15.9 (H) 11.5 - 15.5 %   Platelets 414 (H) 150 - 400 K/uL   nRBC 0.0 0.0 - 0.2 %   ____________________________________________   ____________________________________________  RADIOLOGY  I personally reviewed all radiographic images ordered to evaluate for the above acute complaints and reviewed radiology reports and findings.  These findings were personally discussed with the patient.  Please see medical record for radiology report.  ____________________________________________   PROCEDURES  Procedure(s) performed:  Procedures    Critical Care performed: no ____________________________________________   INITIAL IMPRESSION / ASSESSMENT AND PLAN / ED COURSE  Pertinent labs & imaging results  that were available during my care of the patient were reviewed by me and considered in my medical decision making (see chart for details).   DDX: enteritis, colitis, cysititis, pregnancy, gastritis, pyelo  Amanda Hunter is a 34 y.o. who presents to the ED with was as described above.  Patient nontoxic-appearing but does have mild leukocytosis and abdominal exam is somewhat limited due to the patient's obesity.  CT imaging ordered to evaluate for acute intra-abdominal process.  CT imaging is reassuring.  Will test for Covid.  May have simple gastritis.  No evidence of cystitis.  She patient nontoxic-appearing and appropriate for outpatient follow-up.  Have discussed with the  patient and available family all diagnostics and treatments performed thus far and all questions were answered to the best of my ability. The patient demonstrates understanding and agreement with plan.      The patient was evaluated in Emergency Department today for the symptoms described in the history of present illness. He/she was evaluated in the context of the global COVID-19 pandemic, which necessitated consideration that the patient might be at risk for infection with the SARS-CoV-2 virus that causes COVID-19. Institutional protocols and algorithms that pertain to the evaluation of patients at risk for COVID-19 are in a state of rapid change based on information released by regulatory bodies including the CDC and federal and state organizations. These policies and algorithms were followed during the patient's care in the ED.  As part of my medical decision making, I reviewed the following data within the electronic MEDICAL RECORD NUMBER Nursing notes reviewed and incorporated, Labs reviewed, notes from prior ED visits and [place] Controlled Substance Database   ____________________________________________   FINAL CLINICAL IMPRESSION(S) / ED DIAGNOSES  Final diagnoses:  Generalized abdominal pain      NEW MEDICATIONS  STARTED DURING THIS VISIT:  New Prescriptions   FAMOTIDINE (PEPCID) 20 MG TABLET    Take 1 tablet (20 mg total) by mouth daily.     Note:  This document was prepared using Dragon voice recognition software and may include unintentional dictation errors.    Willy Eddy, MD 04/07/19 2045

## 2019-04-09 LAB — NOVEL CORONAVIRUS, NAA (HOSP ORDER, SEND-OUT TO REF LAB; TAT 18-24 HRS): SARS-CoV-2, NAA: NOT DETECTED

## 2019-05-01 ENCOUNTER — Other Ambulatory Visit: Payer: Self-pay

## 2019-05-01 ENCOUNTER — Emergency Department: Payer: Medicaid Other

## 2019-05-01 ENCOUNTER — Emergency Department
Admission: EM | Admit: 2019-05-01 | Discharge: 2019-05-01 | Disposition: A | Discharge status: Home / Self Care | Payer: Medicaid Other | Attending: Emergency Medicine | Admitting: Emergency Medicine

## 2019-05-01 DIAGNOSIS — M549 Dorsalgia, unspecified: Secondary | ICD-10-CM | POA: Diagnosis present

## 2019-05-01 DIAGNOSIS — I10 Essential (primary) hypertension: Secondary | ICD-10-CM | POA: Diagnosis not present

## 2019-05-01 DIAGNOSIS — R1031 Right lower quadrant pain: Secondary | ICD-10-CM | POA: Insufficient documentation

## 2019-05-01 LAB — URINALYSIS, COMPLETE (UACMP) WITH MICROSCOPIC
Bacteria, UA: NONE SEEN
Bilirubin Urine: NEGATIVE
Glucose, UA: NEGATIVE mg/dL
Ketones, ur: NEGATIVE mg/dL
Leukocytes,Ua: NEGATIVE
Nitrite: NEGATIVE
Protein, ur: NEGATIVE mg/dL
RBC / HPF: >50 RBC/hpf — ABNORMAL HIGH (ref 0–5)
Specific Gravity, Urine: 1.012 (ref 1.005–1.030)
pH: 6 (ref 5.0–8.0)

## 2019-05-01 LAB — COMPREHENSIVE METABOLIC PANEL
ALT: 26 U/L (ref 0–44)
AST: 17 U/L (ref 15–41)
Albumin: 3.9 g/dL (ref 3.5–5.0)
Alkaline Phosphatase: 69 U/L (ref 38–126)
Anion gap: 8 (ref 5–15)
BUN: 7 mg/dL (ref 6–20)
CO2: 29 mmol/L (ref 22–32)
Calcium: 9.4 mg/dL (ref 8.9–10.3)
Chloride: 100 mmol/L (ref 98–111)
Creatinine, Ser: 0.75 mg/dL (ref 0.44–1.00)
GFR calc Af Amer: >60 mL/min (ref >60)
GFR calc non Af Amer: >60 mL/min (ref >60)
Glucose, Bld: 96 mg/dL (ref 70–99)
Potassium: 3.7 mmol/L (ref 3.5–5.1)
Sodium: 137 mmol/L (ref 135–145)
Total Bilirubin: 0.6 mg/dL (ref 0.3–1.2)
Total Protein: 7.7 g/dL (ref 6.5–8.1)

## 2019-05-01 LAB — CBC
HCT: 41.3 % (ref 36.0–46.0)
Hemoglobin: 13.1 g/dL (ref 12.0–15.0)
MCH: 23.1 pg — ABNORMAL LOW (ref 26.0–34.0)
MCHC: 31.7 g/dL (ref 30.0–36.0)
MCV: 72.7 fL — ABNORMAL LOW (ref 80.0–100.0)
Platelets: 427 10*3/uL — ABNORMAL HIGH (ref 150–400)
RBC: 5.68 MIL/uL — ABNORMAL HIGH (ref 3.87–5.11)
RDW: 16.8 % — ABNORMAL HIGH (ref 11.5–15.5)
WBC: 13.4 10*3/uL — ABNORMAL HIGH (ref 4.0–10.5)
nRBC: 0 % (ref 0.0–0.2)

## 2019-05-01 LAB — POCT PREGNANCY, URINE: Preg Test, Ur: NEGATIVE

## 2019-05-01 LAB — LIPASE, BLOOD: Lipase: 19 U/L (ref 11–51)

## 2019-05-01 MED ORDER — SODIUM CHLORIDE 0.9% FLUSH: 3.0000 mL | Freq: Once | INTRAVENOUS | Status: DC

## 2019-05-01 MED ORDER — IBUPROFEN 800 MG PO TABS: 800.0000 mg | ORAL_TABLET | Freq: Three times a day (TID) | ORAL | 0 refills | Status: AC | PRN

## 2019-05-01 MED ORDER — KETOROLAC TROMETHAMINE 30 MG/ML IJ SOLN
30.0000 mg | Freq: Once | INTRAMUSCULAR | Status: DC
  Filled 2019-05-01: qty 1

## 2019-05-01 MED ORDER — IBUPROFEN 800 MG PO TABS
800.0000 mg | ORAL_TABLET | Freq: Once | ORAL | Status: AC
  Administered 2019-05-01: 800 mg via ORAL
  Filled 2019-05-01: qty 1

## 2019-05-01 NOTE — ED Triage Notes (Signed)
Pt to the er for lower abd cramping, vaginal blood clots during her period which is abnormal. Pt also c/o right side back and flank pain. Pt denies hx of ectopic or ovarian cysts. Pt denies hx of kidney stones.

## 2019-05-01 NOTE — ED Notes (Signed)
Pt st she does not want pain medication in the form of a shot at this time. NP notified

## 2019-05-01 NOTE — ED Notes (Signed)
Patient returned from CT

## 2019-05-01 NOTE — ED Provider Notes (Signed)
Emergency Department Provider Note  ____________________________________________  Time seen: Approximately 8:10 PM  I have reviewed the triage vital signs and the nursing notes.   HISTORY  Chief Complaint No chief complaint on file.   Historian Patient     HPI Amanda Hunter is a 34 y.o. female presents to the emergency department with bilateral upper back pain for 1 day.  Patient reports that she started her menstrual cycle 1 day ago.  She has had some mild nausea but denies vomiting.  No dysuria or increased urinary frequency.  She has been afebrile at home.  States that she occasionally has back pain with her menstrual cycle.  No history of pyelonephritis or nephrolithiasis.  No other alleviating measures been attempted.   Past Medical History:  Diagnosis Date  . Hypertension      Immunizations up to date:  Yes.     Past Medical History:  Diagnosis Date  . Hypertension     There are no problems to display for this patient.   History reviewed. No pertinent surgical history.  Prior to Admission medications   Medication Sig Start Date End Date Taking? Authorizing Provider  EPINEPHrine 0.3 mg/0.3 mL IJ SOAJ injection Inject 0.3 mLs (0.3 mg total) into the muscle as needed for up to 2 doses for anaphylaxis. Inject into the upper thigh 11/10/18   Menshew, Dannielle Karvonen, PA-C  famotidine (PEPCID) 20 MG tablet Take 1 tablet (20 mg total) by mouth daily. 04/07/19 04/06/20  Merlyn Lot, MD  ibuprofen (ADVIL) 800 MG tablet Take 1 tablet (800 mg total) by mouth every 8 (eight) hours as needed for up to 5 days. 05/01/19 05/06/19  Lannie Fields, PA-C  fluticasone (FLONASE) 50 MCG/ACT nasal spray Place 1 spray into both nostrils 2 (two) times daily. 08/27/18 12/18/18  Cuthriell, Charline Bills, PA-C    Allergies Patient has no known allergies.  No family history on file.  Social History Social History   Tobacco Use  . Smoking status: Never Smoker  . Smokeless tobacco:  Never Used  Substance Use Topics  . Alcohol use: No  . Drug use: No     Review of Systems  Constitutional: No fever/chills Eyes:  No discharge ENT: No upper respiratory complaints. Respiratory: no cough. No SOB/ use of accessory muscles to breath Gastrointestinal:   No nausea, no vomiting.  No diarrhea.  No constipation. Musculoskeletal: Patient has upper back pain.  Skin: Negative for rash, abrasions, lacerations, ecchymosis.   ____________________________________________   PHYSICAL EXAM:  VITAL SIGNS: ED Triage Vitals  Enc Vitals Group     BP 05/01/19 1735 (!) 168/107     Pulse Rate 05/01/19 1735 87     Resp 05/01/19 1735 18     Temp 05/01/19 1735 98.4 F (36.9 C)     Temp Source 05/01/19 1735 Oral     SpO2 05/01/19 1735 100 %     Weight 05/01/19 1739 276 lb (125.2 kg)     Height 05/01/19 1739 5\' 4"  (1.626 m)     Head Circumference --      Peak Flow --      Pain Score 05/01/19 1739 10     Pain Loc --      Pain Edu? --      Excl. in [place]? --      Constitutional: Alert and oriented. Well appearing and in no acute distress. Eyes: Conjunctivae are normal. PERRL. EOMI. Head: Atraumatic. Cardiovascular: Normal rate, regular rhythm. Normal S1 and S2.  Good peripheral circulation. Respiratory: Normal respiratory effort without tachypnea or retractions. Lungs CTAB. Good air entry to the bases with no decreased or absent breath sounds Gastrointestinal: Bowel sounds x 4 quadrants. Soft and nontender to palpation. No guarding or rigidity. No distention. No CVA tenderness.  Musculoskeletal: Full range of motion to all extremities. No obvious deformities noted Neurologic:  Normal for age. No gross focal neurologic deficits are appreciated.  Skin:  Skin is warm, dry and intact. No rash noted. Psychiatric: Mood and affect are normal for age. Speech and behavior are normal.   ____________________________________________   LABS (all labs ordered are listed, but only abnormal  results are displayed)  Labs Reviewed  CBC - Abnormal; Notable for the following components:      Result Value   WBC 13.4 (*)    RBC 5.68 (*)    MCV 72.7 (*)    MCH 23.1 (*)    RDW 16.8 (*)    Platelets 427 (*)    All other components within normal limits  URINALYSIS, COMPLETE (UACMP) WITH MICROSCOPIC - Abnormal; Notable for the following components:   Color, Urine YELLOW (*)    APPearance CLEAR (*)    Hgb urine dipstick LARGE (*)    RBC / HPF >50 (*)    All other components within normal limits  LIPASE, BLOOD  COMPREHENSIVE METABOLIC PANEL  POC URINE PREG, ED  POCT PREGNANCY, URINE   ____________________________________________  EKG   ____________________________________________  RADIOLOGY Geraldo Pitter, personally viewed and evaluated these images (plain radiographs) as part of my medical decision making, as well as reviewing the written report by the radiologist.  CT Renal Stone Study  Result Date: 05/01/2019 CLINICAL DATA:  Right flank pain EXAM: CT ABDOMEN AND PELVIS WITHOUT CONTRAST TECHNIQUE: Multidetector CT imaging of the abdomen and pelvis was performed following the standard protocol without IV contrast. COMPARISON:  04/07/2019 FINDINGS: Lower chest: Lung bases are clear. No effusions. Heart is normal size. Hepatobiliary: No focal hepatic abnormality. Gallbladder unremarkable. Pancreas: No focal abnormality or ductal dilatation. Spleen: No focal abnormality.  Normal size. Adrenals/Urinary Tract: No adrenal abnormality. No focal renal abnormality. No stones or hydronephrosis. Urinary bladder is unremarkable. Stomach/Bowel: Stomach, large and small bowel grossly unremarkable. Appendix not definitively seen. No inflammatory process in the right lower quadrant Vascular/Lymphatic: No evidence of aneurysm or adenopathy. Reproductive: Uterus and adnexa unremarkable.  No mass. Other: No free fluid or free air. Musculoskeletal: No acute bony abnormality. IMPRESSION: No acute  findings in the abdomen or pelvis. No renal or ureteral stones.  No hydronephrosis. Electronically Signed   By: Charlett Nose M.D.   On: 05/01/2019 19:16    ____________________________________________    PROCEDURES  Procedure(s) performed:     Procedures     Medications  ibuprofen (ADVIL) tablet 800 mg (800 mg Oral Given 05/01/19 1948)     ____________________________________________   INITIAL IMPRESSION / ASSESSMENT AND PLAN / ED COURSE  Pertinent labs & imaging results that were available during my care of the patient were reviewed by me and considered in my medical decision making (see chart for details).      Assessment and Plan:  Back pain  34 year old female presents to the emergency department with bilateral upper back pain for 1 day since starting her menstrual cycle.  Patient was hypertensive at triage but vital signs were otherwise reassuring.  CBC and CMP were reassuring.  Urine pregnancy testing was negative.  Lipase was within reference range.  Urinalysis revealed a  large amount of blood consistent with menses but not consistent with cystitis.  CT renal stone study revealed no evidence of nephrolithiasis.  Patient was discharged with ibuprofen 800s.  Return precautions were given to return with new or worsening symptoms.  All patient questions were answered.   ____________________________________________  FINAL CLINICAL IMPRESSION(S) / ED DIAGNOSES  Final diagnoses:  Acute back pain, unspecified back location, unspecified back pain laterality      NEW MEDICATIONS STARTED DURING THIS VISIT:  ED Discharge Orders         Ordered    ibuprofen (ADVIL) 800 MG tablet  Every 8 hours PRN     05/01/19 2005              This chart was dictated using voice recognition software/Dragon. Despite best efforts to proofread, errors can occur which can change the meaning. Any change was purely unintentional.     Orvil Feil, PA-C 05/01/19 2018     Concha Se, MD 05/01/19 [id number]

## 2019-05-01 NOTE — ED Notes (Signed)
Pt has lower back pain.  No known injury.  No dysuria.  Pt reports nausea.  Sx began yesterday.  Pt taking tylenol without relief.  Pt alert

## 2019-05-27 ENCOUNTER — Encounter: Payer: Self-pay | Admitting: Emergency Medicine

## 2019-05-27 ENCOUNTER — Other Ambulatory Visit: Payer: Self-pay

## 2019-05-27 ENCOUNTER — Emergency Department: Payer: Medicaid Other

## 2019-05-27 ENCOUNTER — Emergency Department
Admission: EM | Admit: 2019-05-27 | Discharge: 2019-05-27 | Disposition: A | Discharge status: Home / Self Care | Payer: Medicaid Other | Attending: Emergency Medicine | Admitting: Emergency Medicine

## 2019-05-27 DIAGNOSIS — I10 Essential (primary) hypertension: Secondary | ICD-10-CM | POA: Diagnosis not present

## 2019-05-27 DIAGNOSIS — R079 Chest pain, unspecified: Secondary | ICD-10-CM | POA: Diagnosis present

## 2019-05-27 DIAGNOSIS — Z20822 Contact with and (suspected) exposure to covid-19: Secondary | ICD-10-CM | POA: Diagnosis not present

## 2019-05-27 DIAGNOSIS — R0789 Other chest pain: Secondary | ICD-10-CM

## 2019-05-27 LAB — CBC
HCT: 40.3 % (ref 36.0–46.0)
Hemoglobin: 13 g/dL (ref 12.0–15.0)
MCH: 23.4 pg — ABNORMAL LOW (ref 26.0–34.0)
MCHC: 32.3 g/dL (ref 30.0–36.0)
MCV: 72.5 fL — ABNORMAL LOW (ref 80.0–100.0)
Platelets: 426 K/uL — ABNORMAL HIGH (ref 150–400)
RBC: 5.56 MIL/uL — ABNORMAL HIGH (ref 3.87–5.11)
RDW: 16.8 % — ABNORMAL HIGH (ref 11.5–15.5)
WBC: 15.4 K/uL — ABNORMAL HIGH (ref 4.0–10.5)
nRBC: 0 % (ref 0.0–0.2)

## 2019-05-27 LAB — BASIC METABOLIC PANEL
Anion gap: 11 (ref 5–15)
BUN: 10 mg/dL (ref 6–20)
CO2: 27 mmol/L (ref 22–32)
Calcium: 8.9 mg/dL (ref 8.9–10.3)
Chloride: 99 mmol/L (ref 98–111)
Creatinine, Ser: 0.83 mg/dL (ref 0.44–1.00)
GFR calc Af Amer: >60 mL/min (ref >60)
GFR calc non Af Amer: >60 mL/min (ref >60)
Glucose, Bld: 113 mg/dL — ABNORMAL HIGH (ref 70–99)
Potassium: 3.4 mmol/L — ABNORMAL LOW (ref 3.5–5.1)
Sodium: 137 mmol/L (ref 135–145)

## 2019-05-27 LAB — TROPONIN I (HIGH SENSITIVITY)
Troponin I (High Sensitivity): 2 ng/L
Troponin I (High Sensitivity): <2 ng/L

## 2019-05-27 LAB — POCT PREGNANCY, URINE: Preg Test, Ur: NEGATIVE

## 2019-05-27 LAB — FIBRIN DERIVATIVES D-DIMER (ARMC ONLY): Fibrin derivatives D-dimer (ARMC): 669.32 ng/mL (FEU) — ABNORMAL HIGH (ref 0.00–499.00)

## 2019-05-27 MED ORDER — IOHEXOL 350 MG/ML SOLN
75.0000 mL | Freq: Once | INTRAVENOUS | Status: AC | PRN
  Administered 2019-05-27: 75 mL via INTRAVENOUS
  Filled 2019-05-27: qty 75

## 2019-05-27 MED ORDER — SODIUM CHLORIDE 0.9% FLUSH: 3.0000 mL | Freq: Once | INTRAVENOUS | Status: DC

## 2019-05-27 NOTE — ED Notes (Signed)
MD at bedside for update 

## 2019-05-27 NOTE — ED Notes (Signed)
2nd Troponin not drawn per Dr. Marisa Severin.

## 2019-05-27 NOTE — Discharge Instructions (Addendum)
You may take Tylenol or ibuprofen as needed for the chest and back pain.  Your COVID-19 test should result within 1 to 2 days, and you will be called about the result.  Return to the ER for new, worsening, or persistent severe chest pain, back pain, fever, weakness, difficulty breathing, or any other new or worsening symptoms that concern you.

## 2019-05-27 NOTE — ED Notes (Signed)
RN offered pt phone to call mother but pt denied wanting to call and would text sister. Pt also reporting she was going to drive herself home so she did not know what her mother meant by driving up to pick her up.

## 2019-05-27 NOTE — ED Notes (Signed)
MD at bedside to update patient

## 2019-05-27 NOTE — ED Triage Notes (Signed)
Pt presents to ED via POV with c/o lower back pain, chest pain, breast pain, and loss of taste. Pt states all symptoms x several days with most recent being loss of taste. Pt A&O x 4, NAD noted at time of triage.

## 2019-05-27 NOTE — ED Provider Notes (Addendum)
[facility] Emergency Department Provider Note ____________________________________________   First MD Initiated Contact with Patient 05/27/19 1749     (approximate)  I have reviewed the triage vital signs and the nursing notes.   HISTORY  Chief Complaint Chest Pain and Back Pain    HPI Amanda Hunter is a 34 y.o. female with PMH as noted below who presents with chest pain, bilateral, intermittent course, and persisting over the last several days.  It is not exertional.  She also has some pain in her lower to mid back over the same time period.  She also reports a loss of taste over the last day.  She states that a few days ago she initially felt slightly short of breath and had some cough, but the symptoms have resolved.  She denies any fever or chills and has had no vomiting or diarrhea.  She has no known exposures to COVID-19.  Past Medical History:  Diagnosis Date  . Hypertension     There are no problems to display for this patient.   History reviewed. No pertinent surgical history.  Prior to Admission medications   Medication Sig Start Date End Date Taking? Authorizing Provider  EPINEPHrine 0.3 mg/0.3 mL IJ SOAJ injection Inject 0.3 mLs (0.3 mg total) into the muscle as needed for up to 2 doses for anaphylaxis. Inject into the upper thigh 11/10/18   Menshew, Dannielle Karvonen, PA-C  famotidine (PEPCID) 20 MG tablet Take 1 tablet (20 mg total) by mouth daily. 04/07/19 04/06/20  Merlyn Lot, MD  fluticasone (FLONASE) 50 MCG/ACT nasal spray Place 1 spray into both nostrils 2 (two) times daily. 08/27/18 12/18/18  Cuthriell, Charline Bills, PA-C    Allergies Patient has no known allergies.  History reviewed. No pertinent family history.  Social History Social History   Tobacco Use  . Smoking status: Never Smoker  . Smokeless tobacco: Never Used  Substance Use Topics  . Alcohol use: No  . Drug use: No    Review of Systems  Constitutional: No  fever/chills. Eyes: No redness. ENT: No sore throat. Cardiovascular: Positive for chest pain. Respiratory: Denies shortness of breath. Gastrointestinal: No vomiting or diarrhea.  Genitourinary: Negative for dysuria.  Musculoskeletal: Positive for back pain.  Negative for leg swelling. Skin: Negative for rash. Neurological: Negative for headaches, focal weakness or numbness.   ____________________________________________   PHYSICAL EXAM:  VITAL SIGNS: ED Triage Vitals  Enc Vitals Group     BP 05/27/19 1552 (!) 162/96     Pulse Rate 05/27/19 1552 (!) 126     Resp 05/27/19 1552 18     Temp 05/27/19 1552 99 F (37.2 C)     Temp Source 05/27/19 1552 Oral     SpO2 --      Weight 05/27/19 1552 276 lb (125.2 kg)     Height 05/27/19 1552 5\' 4"  (1.626 m)     Head Circumference --      Peak Flow --      Pain Score 05/27/19 1607 10     Pain Loc --      Pain Edu? --      Excl. in [place]? --     Constitutional: Alert and oriented. Well appearing and in no acute distress. Eyes: Conjunctivae are normal.  Head: Atraumatic. Nose: No congestion/rhinnorhea. Mouth/Throat: Mucous membranes are moist.   Neck: Normal range of motion.  Cardiovascular: Normal rate, regular rhythm. Grossly normal heart sounds.  Good peripheral circulation. Respiratory: Normal respiratory effort.  No retractions. Lungs CTAB. Gastrointestinal: No distention.  Musculoskeletal: No lower extremity edema.  No calf or popliteal swelling or tenderness.  Extremities warm and well perfused.  Neurologic:  Normal speech and language. No gross focal neurologic deficits are appreciated.  Skin:  Skin is warm and dry. No rash noted. Psychiatric: Mood and affect are normal. Speech and behavior are normal.  ____________________________________________   LABS (all labs ordered are listed, but only abnormal results are displayed)  Labs Reviewed  BASIC METABOLIC PANEL - Abnormal; Notable for the following components:       Result Value   Potassium 3.4 (*)    Glucose, Bld 113 (*)    All other components within normal limits  CBC - Abnormal; Notable for the following components:   WBC 15.4 (*)    RBC 5.56 (*)    MCV 72.5 (*)    MCH 23.4 (*)    RDW 16.8 (*)    Platelets 426 (*)    All other components within normal limits  FIBRIN DERIVATIVES D-DIMER ([facility] ONLY) - Abnormal; Notable for the following components:   Fibrin derivatives D-dimer ([facility]) [phone] (*)    All other components within normal limits  NOVEL CORONAVIRUS, NAA (HOSP ORDER, SEND-OUT TO REF LAB; TAT 18-24 HRS)  POCT PREGNANCY, URINE  POC URINE PREG, ED  TROPONIN I (HIGH SENSITIVITY)  TROPONIN I (HIGH SENSITIVITY)   ____________________________________________  EKG  ED ECG REPORT I, Dionne Bucy, the attending physician, personally viewed and interpreted this ECG.  Date: 05/27/2019 EKG Time: 1550 Rate: 129 Rhythm: sinus tachycardia QRS Axis: normal Intervals: LPFB ST/T Wave abnormalities: T wave inversions in inferior leads Narrative Interpretation: Nonspecific T wave inversions in inferior leads which are new when compared to EKG of 08/28/2018, likely rate related; no evidence of acute ischemia  ____________________________________________  RADIOLOGY  CXR: No focal infiltrate or other acute abnormality  ____________________________________________   PROCEDURES  Procedure(s) performed: No  Procedures  Critical Care performed: No ____________________________________________   INITIAL IMPRESSION / ASSESSMENT AND PLAN / ED COURSE  Pertinent labs & imaging results that were available during my care of the patient were reviewed by me and considered in my medical decision making (see chart for details).  34 year old female with PMH as noted above presents with gradual onset of bilateral nonexertional chest pain as well as some low to mid back pain over the last several days.  She had some mild cough and shortness of  breath initially, but these resolved.  Over the last day she also reports that she has lost her sensation of taste.  On exam, the patient is overall very well-appearing.  She is hypertensive (which she has a history of) and was initially tachycardic, however during my exam the heart rate came down to 106 without intervention.  Her temperature is borderline elevated.  O2 saturation is 100% when I checked.  She has no respiratory distress or increased work of breathing.  The remainder of the exam is unremarkable.  EKG shows some T wave inversions which are not as evident on her last EKG from last May, but these may be due to the tachycardia at the time of the EKG.  She has no specific ischemic findings.  Chest x-ray shows no focal infiltrate or other acute abnormality  Lab work-up was obtained from triage.  The patient has an elevated WBC count, but a negative troponin and no other significant findings.  Given the altered taste sensation, the presentation is overall most concerning for COVID-19.  However, the patient has no respiratory distress and is oxygenating normally, and so would be stable for discharge home.  There is no clinical evidence for ACS.  Given the duration of the symptoms and the overall low suspicion, the single negative troponin is sufficient to rule this out. Differential also includes benign etiology such as musculoskeletal pain, GERD, or other viral syndrome besides Covid.  She has no urinary symptoms to suggest UTI/pyonephritis.  However, given the patient's tachycardia and the inferior T wave inversions, I cannot fully rule out PE.  I will order a D-dimer.  If this is negative, anticipate discharge home.  ----------------------------------------- 8:56 PM on 05/27/2019 -----------------------------------------  D-dimer is somewhat elevated, so we proceeded with a CT which is negative for PE.  The patient continues to appear comfortable.  At this time, she is stable for  discharge home.  I counseled her on the results of the work-up.  Send-out Covid swab has been obtained.  Return precautions given, and she expresses understanding.  _____________________________  Amanda Hunter was evaluated in Emergency Department on 05/27/2019 for the symptoms described in the history of present illness. She was evaluated in the context of the global COVID-19 pandemic, which necessitated consideration that the patient might be at risk for infection with the SARS-CoV-2 virus that causes COVID-19. Institutional protocols and algorithms that pertain to the evaluation of patients at risk for COVID-19 are in a state of rapid change based on information released by regulatory bodies including the CDC and federal and state organizations. These policies and algorithms were followed during the patient's care in the ED.  ____________________________________________   FINAL CLINICAL IMPRESSION(S) / ED DIAGNOSES  Final diagnoses:  Atypical chest pain      NEW MEDICATIONS STARTED DURING THIS VISIT:  New Prescriptions   No medications on file     Note:  This document was prepared using Dragon voice recognition software and may include unintentional dictation errors.   Dionne Bucy, MD 05/27/19 Sanjuana Mae, MD 05/27/19 [phone]

## 2019-05-27 NOTE — ED Notes (Signed)
PT taken to CT.

## 2019-05-29 LAB — NOVEL CORONAVIRUS, NAA (HOSP ORDER, SEND-OUT TO REF LAB; TAT 18-24 HRS): SARS-CoV-2, NAA: NOT DETECTED

## 2019-06-27 ENCOUNTER — Emergency Department: Payer: Medicaid Other

## 2019-06-27 ENCOUNTER — Emergency Department
Admission: EM | Admit: 2019-06-27 | Discharge: 2019-06-27 | Disposition: A | Discharge status: Home / Self Care | Payer: Medicaid Other | Attending: Emergency Medicine | Admitting: Emergency Medicine

## 2019-06-27 ENCOUNTER — Encounter: Payer: Self-pay | Admitting: Emergency Medicine

## 2019-06-27 ENCOUNTER — Other Ambulatory Visit: Payer: Self-pay

## 2019-06-27 DIAGNOSIS — M79605 Pain in left leg: Secondary | ICD-10-CM | POA: Insufficient documentation

## 2019-06-27 DIAGNOSIS — I10 Essential (primary) hypertension: Secondary | ICD-10-CM | POA: Insufficient documentation

## 2019-06-27 DIAGNOSIS — Z79899 Other long term (current) drug therapy: Secondary | ICD-10-CM | POA: Diagnosis not present

## 2019-06-27 DIAGNOSIS — R102 Pelvic and perineal pain: Secondary | ICD-10-CM | POA: Insufficient documentation

## 2019-06-27 DIAGNOSIS — R109 Unspecified abdominal pain: Secondary | ICD-10-CM | POA: Diagnosis present

## 2019-06-27 LAB — COMPREHENSIVE METABOLIC PANEL
ALT: 17 U/L (ref 0–44)
AST: 19 U/L (ref 15–41)
Albumin: 4.1 g/dL (ref 3.5–5.0)
Alkaline Phosphatase: 62 U/L (ref 38–126)
Anion gap: 7 (ref 5–15)
BUN: 10 mg/dL (ref 6–20)
CO2: 31 mmol/L (ref 22–32)
Calcium: 9 mg/dL (ref 8.9–10.3)
Chloride: 102 mmol/L (ref 98–111)
Creatinine, Ser: 0.8 mg/dL (ref 0.44–1.00)
GFR calc Af Amer: >60 mL/min (ref >60)
GFR calc non Af Amer: >60 mL/min (ref >60)
Glucose, Bld: 98 mg/dL (ref 70–99)
Potassium: 3.9 mmol/L (ref 3.5–5.1)
Sodium: 140 mmol/L (ref 135–145)
Total Bilirubin: 0.6 mg/dL (ref 0.3–1.2)
Total Protein: 7.8 g/dL (ref 6.5–8.1)

## 2019-06-27 LAB — URINALYSIS, COMPLETE (UACMP) WITH MICROSCOPIC
Bacteria, UA: NONE SEEN
Bilirubin Urine: NEGATIVE
Glucose, UA: NEGATIVE mg/dL
Hgb urine dipstick: NEGATIVE
Ketones, ur: NEGATIVE mg/dL
Nitrite: NEGATIVE
Protein, ur: NEGATIVE mg/dL
Specific Gravity, Urine: 1.017 (ref 1.005–1.030)
pH: 7 (ref 5.0–8.0)

## 2019-06-27 LAB — CBC
HCT: 37.7 % (ref 36.0–46.0)
Hemoglobin: 11.9 g/dL — ABNORMAL LOW (ref 12.0–15.0)
MCH: 23 pg — ABNORMAL LOW (ref 26.0–34.0)
MCHC: 31.6 g/dL (ref 30.0–36.0)
MCV: 72.9 fL — ABNORMAL LOW (ref 80.0–100.0)
Platelets: 405 10*3/uL — ABNORMAL HIGH (ref 150–400)
RBC: 5.17 MIL/uL — ABNORMAL HIGH (ref 3.87–5.11)
RDW: 16 % — ABNORMAL HIGH (ref 11.5–15.5)
WBC: 13.9 10*3/uL — ABNORMAL HIGH (ref 4.0–10.5)
nRBC: 0 % (ref 0.0–0.2)

## 2019-06-27 LAB — PREGNANCY, URINE: Preg Test, Ur: NEGATIVE

## 2019-06-27 LAB — LIPASE, BLOOD: Lipase: 27 U/L (ref 11–51)

## 2019-06-27 MED ORDER — MELOXICAM 15 MG PO TABS: 15.0000 mg | ORAL_TABLET | Freq: Every day | ORAL | 1 refills | Status: AC

## 2019-06-27 MED ORDER — SODIUM CHLORIDE 0.9% FLUSH: 3.0000 mL | Freq: Once | INTRAVENOUS | Status: DC

## 2019-06-27 NOTE — ED Notes (Signed)
Pt to US att 

## 2019-06-27 NOTE — ED Provider Notes (Signed)
Emergency Department Provider Note  ____________________________________________  Time seen: Approximately 7:49 PM  I have reviewed the triage vital signs and the nursing notes.   HISTORY  Chief Complaint Abdominal Pain and Leg Pain   Historian Patient    HPI Amanda Hunter is a 34 y.o. female with a history of hypertension, presents to the emergency department with suprapubic pain as well as left calf pain.  Patient states that she has experienced symptoms for approximately 2 weeks.  She cannot state which of her complaints are more troubling.  She states that she has noticed some left calf swelling and became concerned. No erythema.  She denies use of contraceptives, recent prolonged immobilization, recent travel, shortness of breath, chest tightness or prior diagnosis of DVT.  She denies dysuria, hematuria or increased urinary frequency.  Patient states that her menses was atypical last month and she does have concern for pregnancy.  She denies fever and chills at home.  No dysuria, hematuria or increased urinary frequency.   Past Medical History:  Diagnosis Date  . Hypertension      Immunizations up to date:  Yes.     Past Medical History:  Diagnosis Date  . Hypertension     There are no problems to display for this patient.   History reviewed. No pertinent surgical history.  Prior to Admission medications   Medication Sig Start Date End Date Taking? Authorizing Provider  EPINEPHrine 0.3 mg/0.3 mL IJ SOAJ injection Inject 0.3 mLs (0.3 mg total) into the muscle as needed for up to 2 doses for anaphylaxis. Inject into the upper thigh 11/10/18   Menshew, Dannielle Karvonen, PA-C  famotidine (PEPCID) 20 MG tablet Take 1 tablet (20 mg total) by mouth daily. 04/07/19 04/06/20  Merlyn Lot, MD  fluticasone (FLONASE) 50 MCG/ACT nasal spray Place 1 spray into both nostrils 2 (two) times daily. 08/27/18 12/18/18  Cuthriell, Charline Bills, PA-C    Allergies Patient has no known  allergies.  No family history on file.  Social History Social History   Tobacco Use  . Smoking status: Never Smoker  . Smokeless tobacco: Never Used  Substance Use Topics  . Alcohol use: No  . Drug use: No     Review of Systems  Constitutional: No fever/chills Eyes:  No discharge ENT: No upper respiratory complaints. Respiratory: no cough. No SOB/ use of accessory muscles to breath Gastrointestinal: Patient has suprapubic pain.  Musculoskeletal: Patient has left calf pain.  Skin: Negative for rash, abrasions, lacerations, ecchymosis.    ____________________________________________   PHYSICAL EXAM:  VITAL SIGNS: ED Triage Vitals  Enc Vitals Group     BP 06/27/19 1736 138/76     Pulse Rate 06/27/19 1736 (!) 110     Resp 06/27/19 1736 17     Temp 06/27/19 1736 98.7 F (37.1 C)     Temp Source 06/27/19 1736 Oral     SpO2 06/27/19 1736 100 %     Weight 06/27/19 1737 276 lb (125.2 kg)     Height 06/27/19 1737 5\' 4"  (1.626 m)     Head Circumference --      Peak Flow --      Pain Score 06/27/19 1747 10     Pain Loc --      Pain Edu? --      Excl. in [place]? --      Constitutional: Alert and oriented. Well appearing and in no acute distress. Eyes: Conjunctivae are normal. PERRL. EOMI. Head: Atraumatic. ENT:  Nose: No congestion/rhinnorhea.      Mouth/Throat: Mucous membranes are moist.  Neck: No stridor.  No cervical spine tenderness to palpation. Cardiovascular: Normal rate, regular rhythm. Normal S1 and S2.  Good peripheral circulation. Respiratory: Normal respiratory effort without tachypnea or retractions. Lungs CTAB. Good air entry to the bases with no decreased or absent breath sounds Gastrointestinal: Bowel sounds x 4 quadrants.  Patient has suprapubic tenderness to palpation. Musculoskeletal: Patient has tenderness to left calf palpation.  No erythema.  No pitting edema of the left calf.  Neurologic:  Normal for age. No gross focal neurologic deficits  are appreciated.  Skin:  Skin is warm, dry and intact. No rash noted. Psychiatric: Mood and affect are normal for age. Speech and behavior are normal.   ____________________________________________   LABS (all labs ordered are listed, but only abnormal results are displayed)  Labs Reviewed  CBC - Abnormal; Notable for the following components:      Result Value   WBC 13.9 (*)    RBC 5.17 (*)    Hemoglobin 11.9 (*)    MCV 72.9 (*)    MCH 23.0 (*)    RDW 16.0 (*)    Platelets 405 (*)    All other components within normal limits  URINALYSIS, COMPLETE (UACMP) WITH MICROSCOPIC - Abnormal; Notable for the following components:   Color, Urine YELLOW (*)    APPearance HAZY (*)    Leukocytes,Ua TRACE (*)    All other components within normal limits  LIPASE, BLOOD  COMPREHENSIVE METABOLIC PANEL  PREGNANCY, URINE   ____________________________________________  EKG   ____________________________________________  RADIOLOGY Geraldo Pitter, personally viewed and evaluated these images (plain radiographs) as part of my medical decision making, as well as reviewing the written report by the radiologist.  No results found.  ____________________________________________    PROCEDURES  Procedure(s) performed:     Procedures     Medications - No data to display   ____________________________________________   INITIAL IMPRESSION / ASSESSMENT AND PLAN / ED COURSE  Pertinent labs & imaging results that were available during my care of the patient were reviewed by me and considered in my medical decision making (see chart for details).      Assessment and plan: Suprapubic pain: Left calf pain:  34 year old female presents to the emergency department with left calf pain and suprapubic pain for the past 2 weeks.  Patient was mildly tachycardic at triage but vital signs were otherwise reassuring.  She had suprapubic tenderness to palpation without guarding.  She did  have some tenderness to palpation of the left calf there was no notable pitting edema or erythema.  Differential diagnosis included cystitis, ovarian cyst, ovarian torsion, DVT...  We will obtain ultrasounds of the left lower extremity to rule out DVT and pelvic ultrasound.  Pelvic ultrasound and left lower extremity ultrasound revealed no acute abnormality.  Patient was discharged with meloxicam.  All patient questions were answered.  ____________________________________________  FINAL CLINICAL IMPRESSION(S) / ED DIAGNOSES  Final diagnoses:  Pelvic pain      NEW MEDICATIONS STARTED DURING THIS VISIT:  ED Discharge Orders    None          This chart was dictated using voice recognition software/Dragon. Despite best efforts to proofread, errors can occur which can change the meaning. Any change was purely unintentional.     Orvil Feil, PA-C 06/27/19 2313    Phineas Semen, MD 06/27/19 [phone]

## 2019-06-27 NOTE — ED Notes (Signed)
Pt returned from [facility] att 

## 2019-06-27 NOTE — ED Triage Notes (Signed)
Pt to ED via for abd pain and left leg pain. Pt states that her leg has been hurting since Monday. Pt denies injury but states that she thinks her leg is swollen. Pt states that she has been having abd pain for 1.5 weeks. Pt states pain is in her lower abd. Pt states that she had some nausea this morning. Pt denies V/D, fevers or chills. Pt is in NAD.

## 2019-06-27 NOTE — ED Notes (Signed)
[place] notified preg test resulted.

## 2019-06-28 LAB — POCT PREGNANCY, URINE: Preg Test, Ur: NEGATIVE

## 2019-07-29 ENCOUNTER — Ambulatory Visit: Payer: Medicaid Other | Attending: Neurology

## 2019-07-29 DIAGNOSIS — G4733 Obstructive sleep apnea (adult) (pediatric): Secondary | ICD-10-CM | POA: Insufficient documentation

## 2019-07-30 ENCOUNTER — Encounter: Payer: Self-pay | Admitting: Emergency Medicine

## 2019-07-30 ENCOUNTER — Other Ambulatory Visit: Payer: Self-pay

## 2019-07-30 ENCOUNTER — Emergency Department
Admission: EM | Admit: 2019-07-30 | Discharge: 2019-07-30 | Disposition: A | Discharge status: Home / Self Care | Payer: Medicaid Other | Attending: Emergency Medicine | Admitting: Emergency Medicine

## 2019-07-30 DIAGNOSIS — I10 Essential (primary) hypertension: Secondary | ICD-10-CM | POA: Insufficient documentation

## 2019-07-30 DIAGNOSIS — Y929 Unspecified place or not applicable: Secondary | ICD-10-CM | POA: Diagnosis not present

## 2019-07-30 DIAGNOSIS — Y939 Activity, unspecified: Secondary | ICD-10-CM | POA: Insufficient documentation

## 2019-07-30 DIAGNOSIS — W57XXXA Bitten or stung by nonvenomous insect and other nonvenomous arthropods, initial encounter: Secondary | ICD-10-CM | POA: Insufficient documentation

## 2019-07-30 DIAGNOSIS — Y999 Unspecified external cause status: Secondary | ICD-10-CM | POA: Diagnosis not present

## 2019-07-30 DIAGNOSIS — S90561A Insect bite (nonvenomous), right ankle, initial encounter: Secondary | ICD-10-CM | POA: Diagnosis not present

## 2019-07-30 NOTE — ED Provider Notes (Signed)
[facility] Emergency Department Provider Note  ____________________________________________   First MD Initiated Contact with Patient 07/30/19 1342     (approximate)  I have reviewed the triage vital signs and the nursing notes.   HISTORY  Chief Complaint Insect Bite    HPI Amanda Hunter is a 34 y.o. female presents emergency department complaint of insect bite prior to arrival.  States area is itchy and spreading.  No fever chills.  No drainage from site.  Unsure what bit her.    Past Medical History:  Diagnosis Date  . Hypertension     There are no problems to display for this patient.   History reviewed. No pertinent surgical history.  Prior to Admission medications   Medication Sig Start Date End Date Taking? Authorizing Provider  EPINEPHrine 0.3 mg/0.3 mL IJ SOAJ injection Inject 0.3 mLs (0.3 mg total) into the muscle as needed for up to 2 doses for anaphylaxis. Inject into the upper thigh 11/10/18   Menshew, Dannielle Karvonen, PA-C  fluticasone (FLONASE) 50 MCG/ACT nasal spray Place 1 spray into both nostrils 2 (two) times daily. 08/27/18 12/18/18  Cuthriell, Charline Bills, PA-C    Allergies Patient has no known allergies.  No family history on file.  Social History Social History   Tobacco Use  . Smoking status: Never Smoker  . Smokeless tobacco: Never Used  Substance Use Topics  . Alcohol use: No  . Drug use: No    Review of Systems  Constitutional: No fever/chills Eyes: No visual changes. ENT: No sore throat. Respiratory: Denies cough Genitourinary: Negative for dysuria. Musculoskeletal: Negative for back pain. Skin: Negative for rash.  Positive insect bite Psychiatric: no mood changes,     ____________________________________________   PHYSICAL EXAM:  VITAL SIGNS: ED Triage Vitals  Enc Vitals Group     BP 07/30/19 1328 (!) 141/85     Pulse Rate 07/30/19 1328 (!) 109     Resp 07/30/19 1328 20     Temp 07/30/19  1328 98.5 F (36.9 C)     Temp Source 07/30/19 1328 Oral     SpO2 07/30/19 1328 100 %     Weight 07/30/19 1327 276 lb 0.3 oz (125.2 kg)     Height 07/30/19 1327 5\' 4"  (1.626 m)     Head Circumference --      Peak Flow --      Pain Score 07/30/19 1327 0     Pain Loc --      Pain Edu? --      Excl. in [place]? --     Constitutional: Alert and oriented. Well appearing and in no acute distress. Eyes: Conjunctivae are normal.  Head: Atraumatic. Nose: No congestion/rhinnorhea. Mouth/Throat: Mucous membranes are moist.   Neck:  supple no lymphadenopathy noted Cardiovascular: Normal rate, regular rhythm. Heart sounds are normal Respiratory: Normal respiratory effort.  No retractions, lungs c t a  GU: deferred Musculoskeletal: FROM all extremities, warm and well perfused Neurologic:  Normal speech and language.  Skin:  Skin is warm, dry and intact.  Small swollen area typical of an insect bite such as a mosquito noted Psychiatric: Mood and affect are normal. Speech and behavior are normal.  ____________________________________________   LABS (all labs ordered are listed, but only abnormal results are displayed)  Labs Reviewed - No data to display ____________________________________________   ____________________________________________  RADIOLOGY    ____________________________________________   PROCEDURES  Procedure(s) performed: No  Procedures    ____________________________________________   INITIAL  IMPRESSION / ASSESSMENT AND PLAN / ED COURSE  Pertinent labs & imaging results that were available during my care of the patient were reviewed by me and considered in my medical decision making (see chart for details).   Patient is 34 year old female presents emergency department complaint Bug bite to the right ankle.  See HPI  Physical exam shows patient to appear well.  There is a minimal sized bug bite noted on the right ankle.  Remainder the exam is  unremarkable  Explained to the patient that she could use over-the-counter Benadryl and hydrocortisone cream.  Apply ice.  Return if worsening.  She discharged stable condition.    Amanda Hunter was evaluated in Emergency Department on 07/30/2019 for the symptoms described in the history of present illness. She was evaluated in the context of the global COVID-19 pandemic, which necessitated consideration that the patient might be at risk for infection with the SARS-CoV-2 virus that causes COVID-19. Institutional protocols and algorithms that pertain to the evaluation of patients at risk for COVID-19 are in a state of rapid change based on information released by regulatory bodies including the CDC and federal and state organizations. These policies and algorithms were followed during the patient's care in the ED.   As part of my medical decision making, I reviewed the following data within the electronic MEDICAL RECORD NUMBER Nursing notes reviewed and incorporated, Old chart reviewed, Notes from prior ED visits and [place] Controlled Substance Database  ____________________________________________   FINAL CLINICAL IMPRESSION(S) / ED DIAGNOSES  Final diagnoses:  Insect bite of right ankle, initial encounter      NEW MEDICATIONS STARTED DURING THIS VISIT:  New Prescriptions   No medications on file     Note:  This document was prepared using Dragon voice recognition software and may include unintentional dictation errors.    Faythe Ghee, PA-C 07/30/19 1353    Jene Every, MD 07/30/19 [phone]

## 2019-07-30 NOTE — Discharge Instructions (Signed)
Take over-the-counter Benadryl and use hydrocortisone cream on the area.  This will help stop itching.  He can also apply ice.  Return if worsening

## 2019-07-30 NOTE — ED Notes (Signed)
See triage note  Presents with possible insect bite to right foot  Foot is red and swollen  Is able to bear wt

## 2020-08-30 IMAGING — CT CT RENAL STONE PROTOCOL
2 series · 15 of 36 positions shown, 19 images · non-contrast
Comparison: 04/07/2019

CLINICAL DATA: Right flank pain

EXAM:
CT ABDOMEN AND PELVIS WITHOUT CONTRAST
TECHNIQUE: Multidetector CT imaging of the abdomen and pelvis was performed
following the standard protocol without IV contrast.

[Series 4: lung bases · axial · 0.73mm/px · z∈[-204,-59]mm · 14 of 34 slices shown, 17 images]
[im 3/34  soft-tissue]
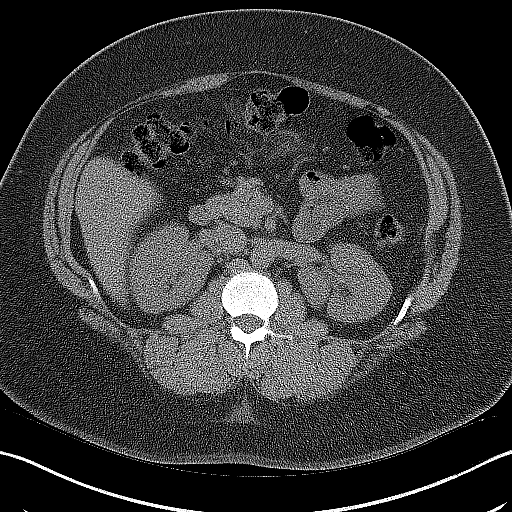
[im 3/34  bone]
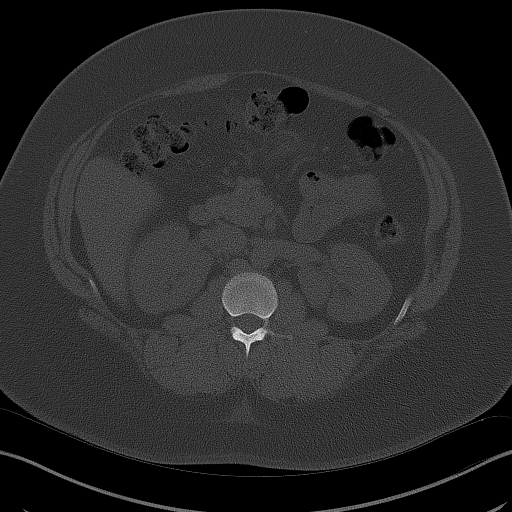
[im 6/34  soft-tissue]
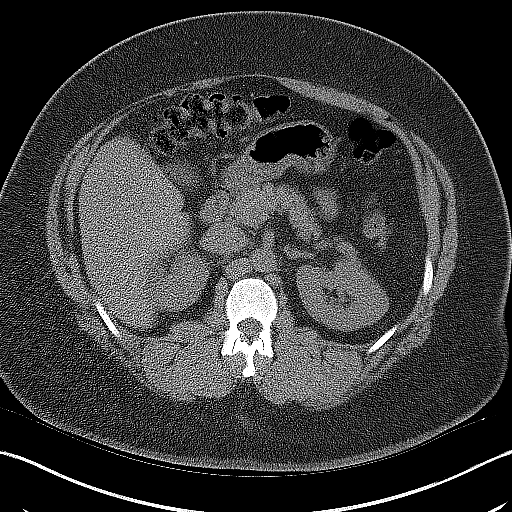
[im 8/34  soft-tissue]
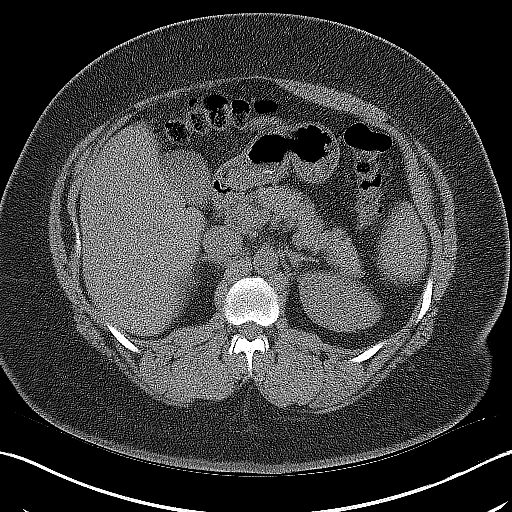
[im 11/34  soft-tissue]
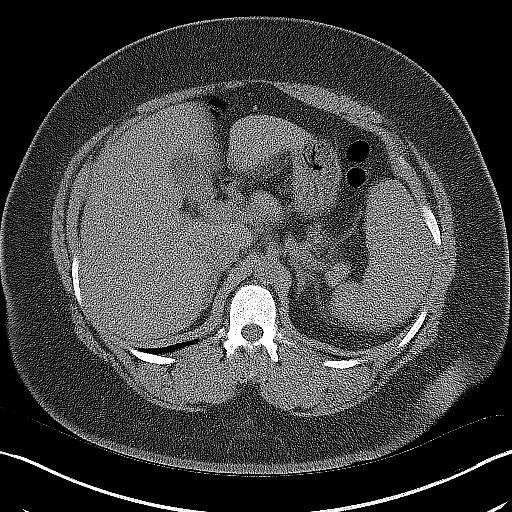
[im 14/34  soft-tissue]
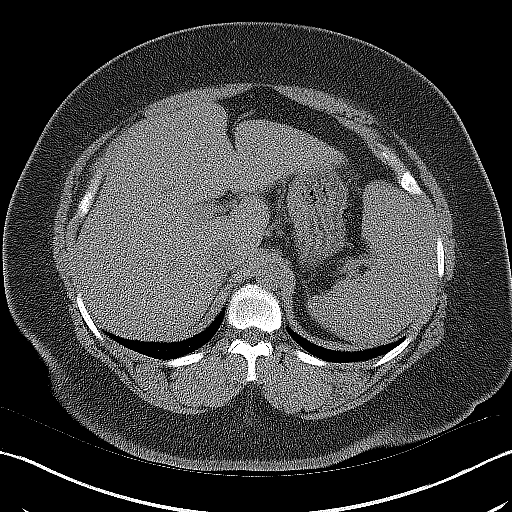
[im 18/34  soft-tissue]
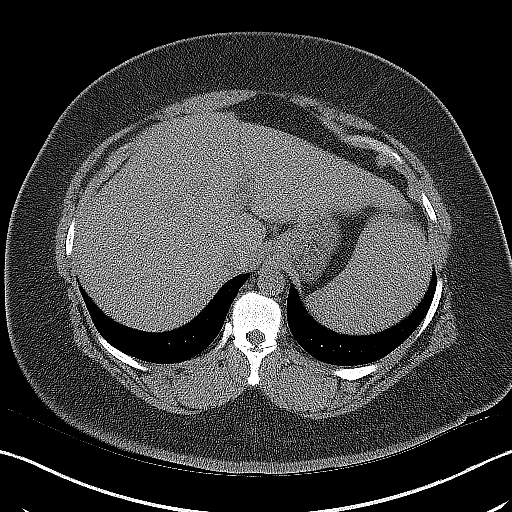
[im 20/34  soft-tissue]
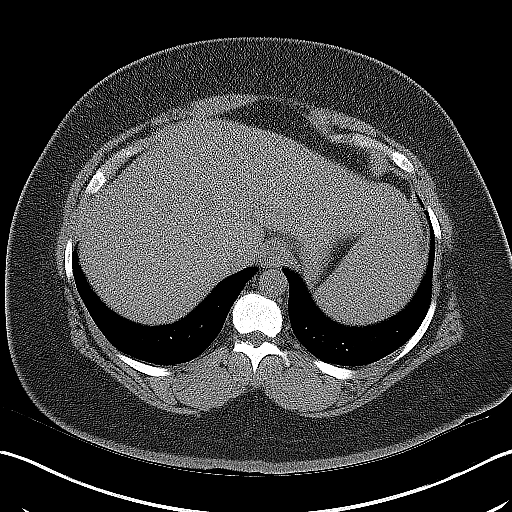
[im 23/34  soft-tissue]
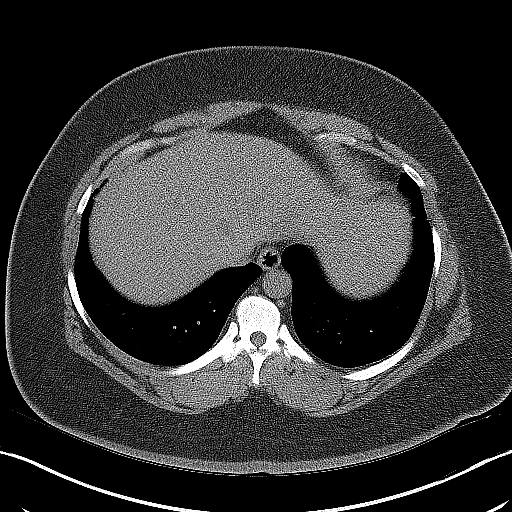
[im 26/34  soft-tissue]
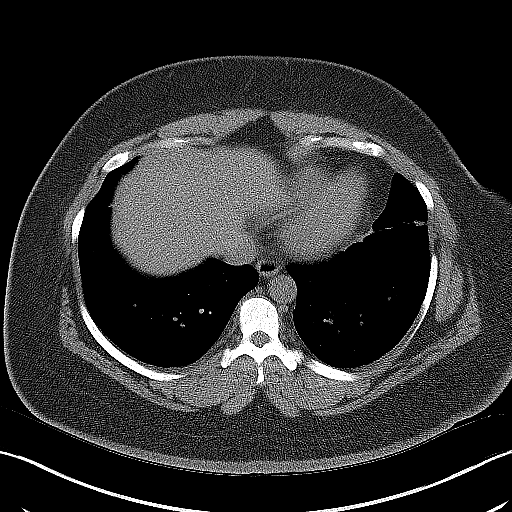
[im 26/34  bone]
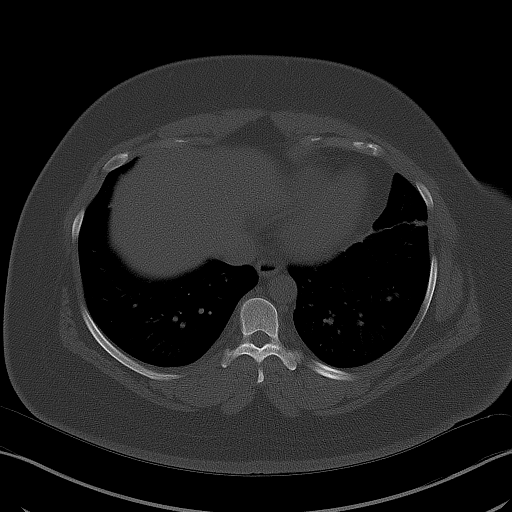
[im 28/34  soft-tissue]
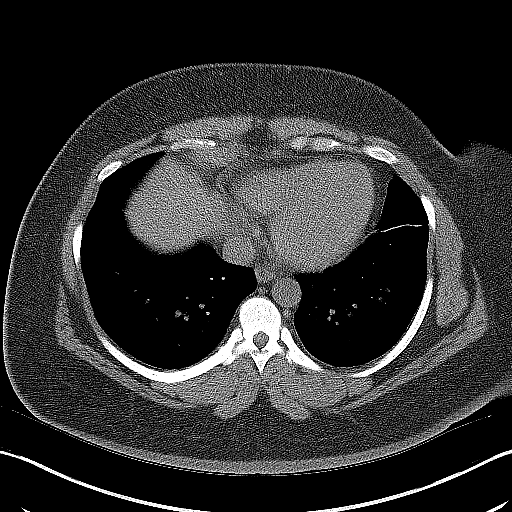
[im 29/34  lung]
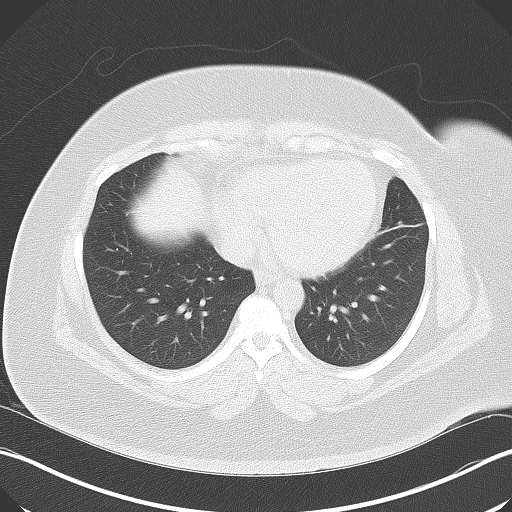
[im 30/34  lung]
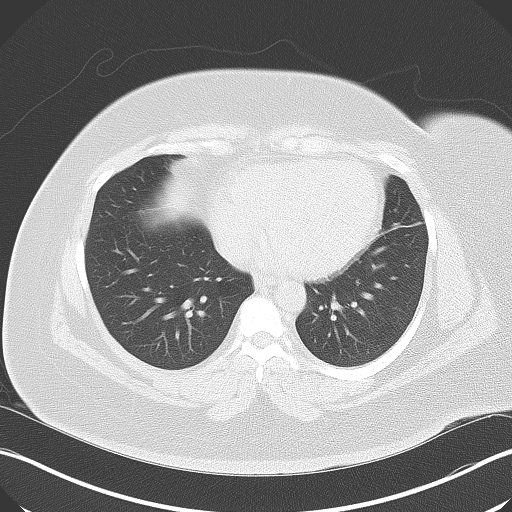
[im 31/34  soft-tissue]
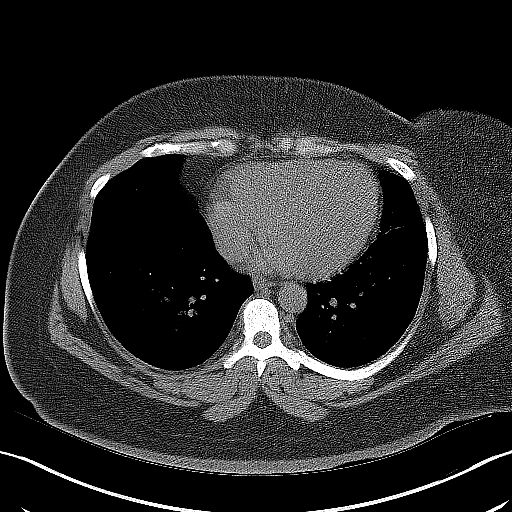
[im 31/34  lung]
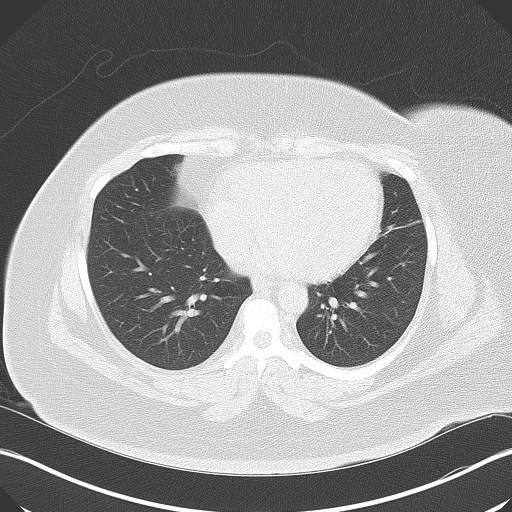
[im 32/34  lung]
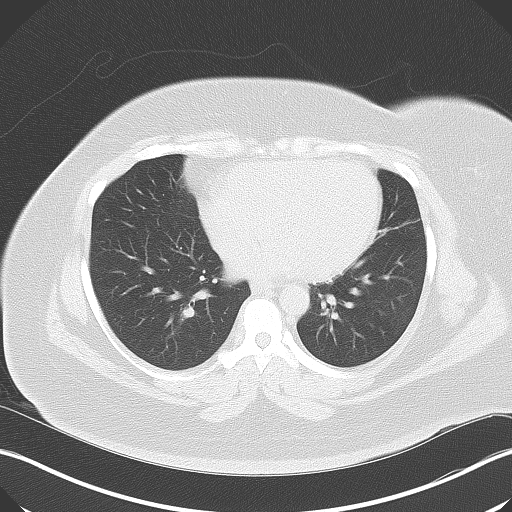

[Series 6: sagittal · sagittal · 0.76mm/px · 1 of 165 slices shown, 2 images]
[im 55/165  soft-tissue]
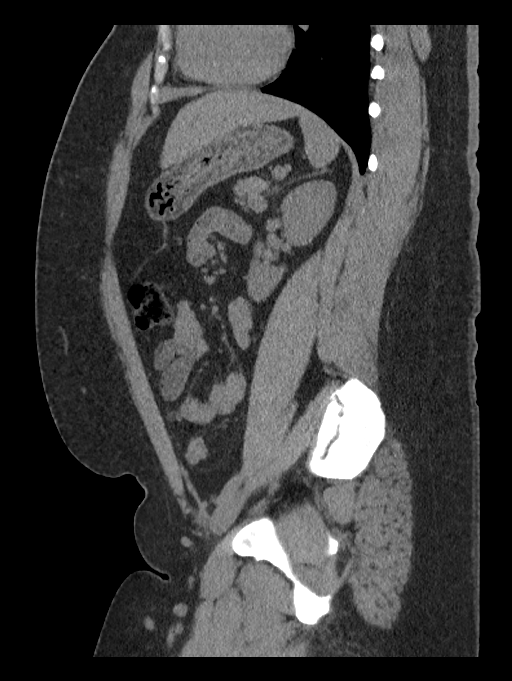
[im 55/165  bone]
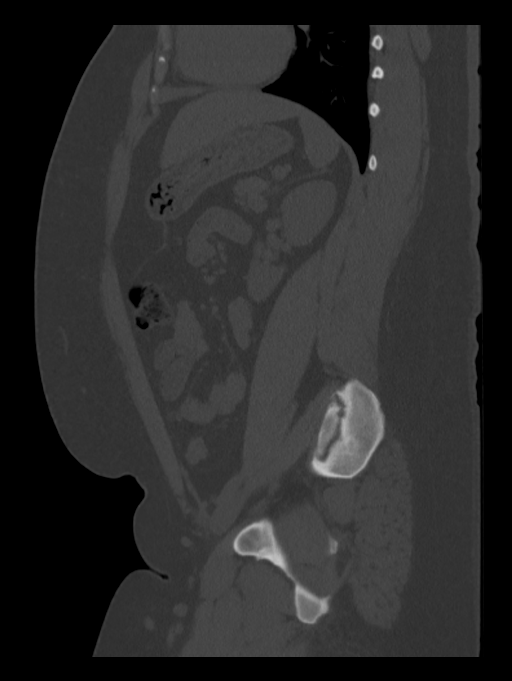

[15 of 36 positions shown; findings below may reference images not displayed]

FINDINGS: Lower chest: Lung bases are clear. No effusions. Heart is normal
size.

Hepatobiliary: No focal hepatic abnormality. Gallbladder
unremarkable.

Pancreas: No focal abnormality or ductal dilatation.

Spleen: No focal abnormality.  Normal size.

Adrenals/Urinary Tract: No adrenal abnormality. No focal renal
abnormality. No stones or hydronephrosis. Urinary bladder is
unremarkable.

Stomach/Bowel: Stomach, large and small bowel grossly unremarkable.
Appendix not definitively seen. No inflammatory process in the right
lower quadrant

Vascular/Lymphatic: No evidence of aneurysm or adenopathy.

Reproductive: Uterus and adnexa unremarkable.  No mass.

Other: No free fluid or free air.

Musculoskeletal: No acute bony abnormality.
IMPRESSION: No acute findings in the abdomen or pelvis.

No renal or ureteral stones.  No hydronephrosis.
